# Patient Record
Sex: Male | Born: 1965 | Race: White | Hispanic: No | Marital: Married | Smoking: Current every day smoker
Health system: Southern US, Community
[De-identification: ages and names within clinical notes are randomized; demographics above are authoritative.]

---

## 2005-07-14 ENCOUNTER — Emergency Department (HOSPITAL_COMMUNITY): Admission: EM | Admit: 2005-07-14 | Discharge: 2005-07-15 | Payer: Self-pay | Admitting: Emergency Medicine

## 2010-01-28 ENCOUNTER — Emergency Department (HOSPITAL_COMMUNITY)
Admission: EM | Admit: 2010-01-28 | Discharge: 2010-01-28 | Payer: Self-pay | Source: Home / Self Care | Admitting: Emergency Medicine

## 2012-08-16 IMAGING — CR DG FINGER THUMB 2+V*R*
4 series · 4 of 4 positions shown · non-contrast
Comparison: None.

CLINICAL DATA: Laceration

RIGHT THUMB 2+V

[x finger pa right (1 of 2)]
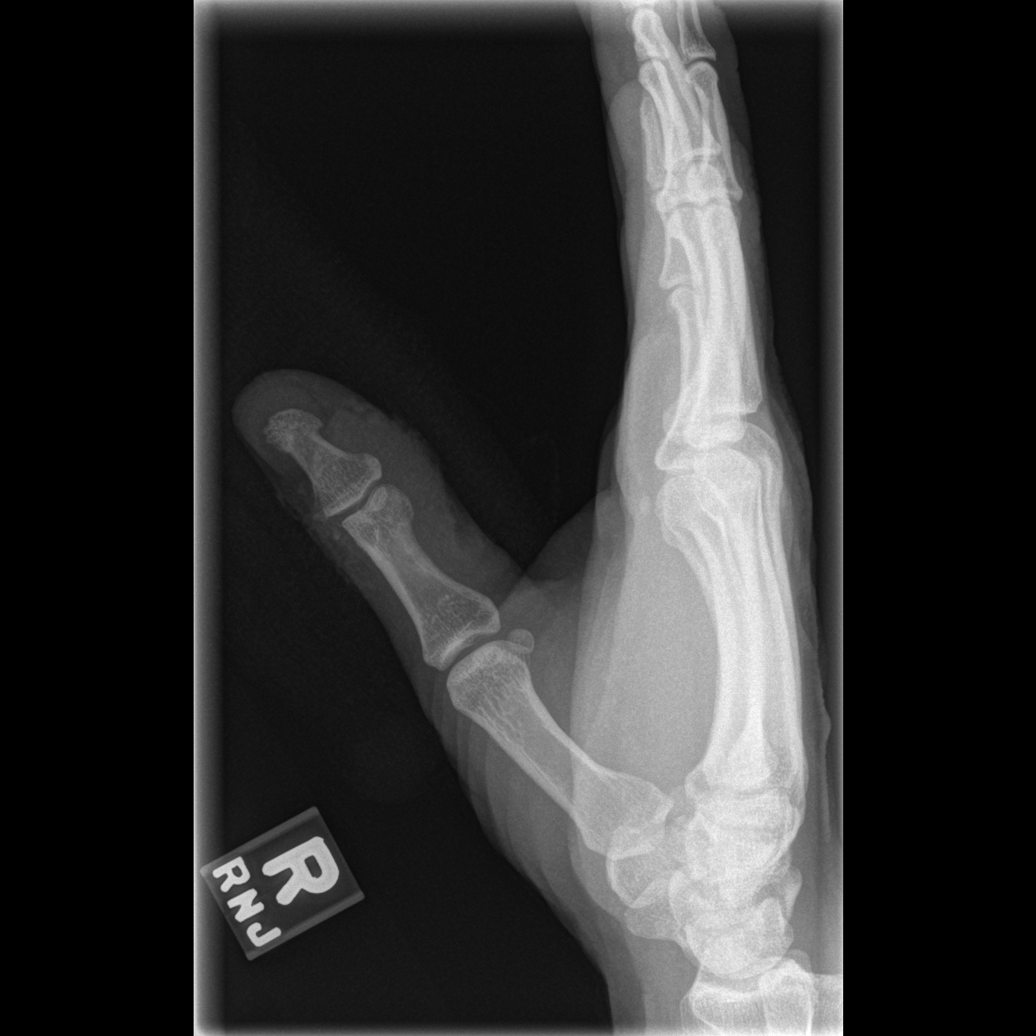

[x finger pa right (2 of 2)]
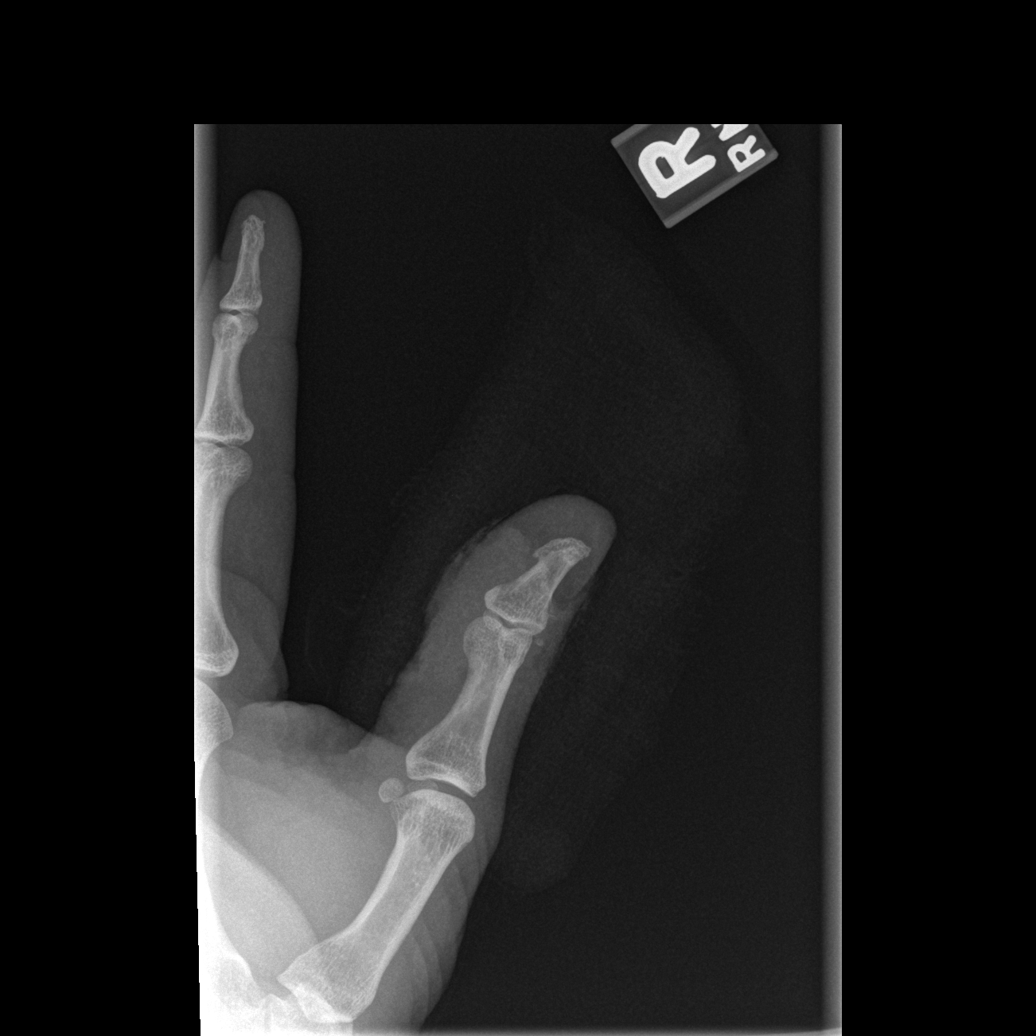

[x finger obl. right]
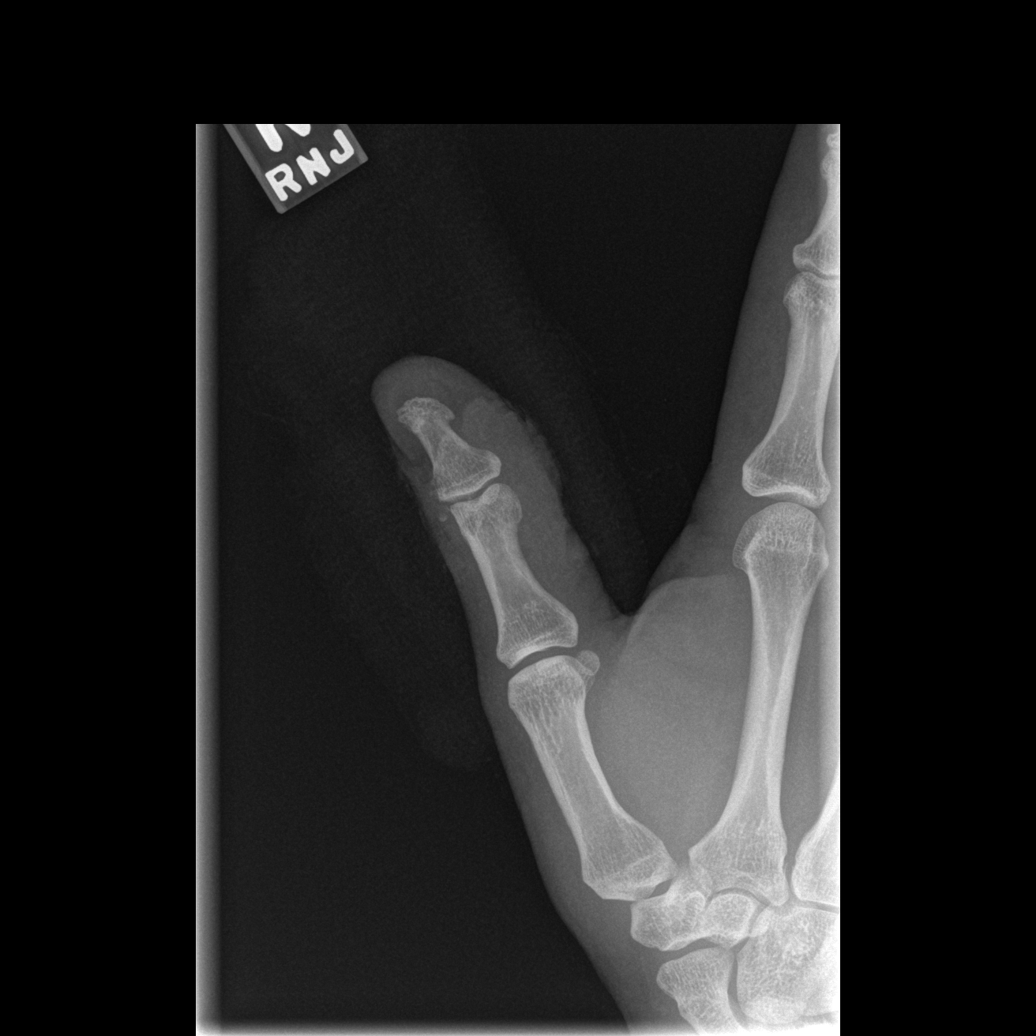

[x finger lateral right]
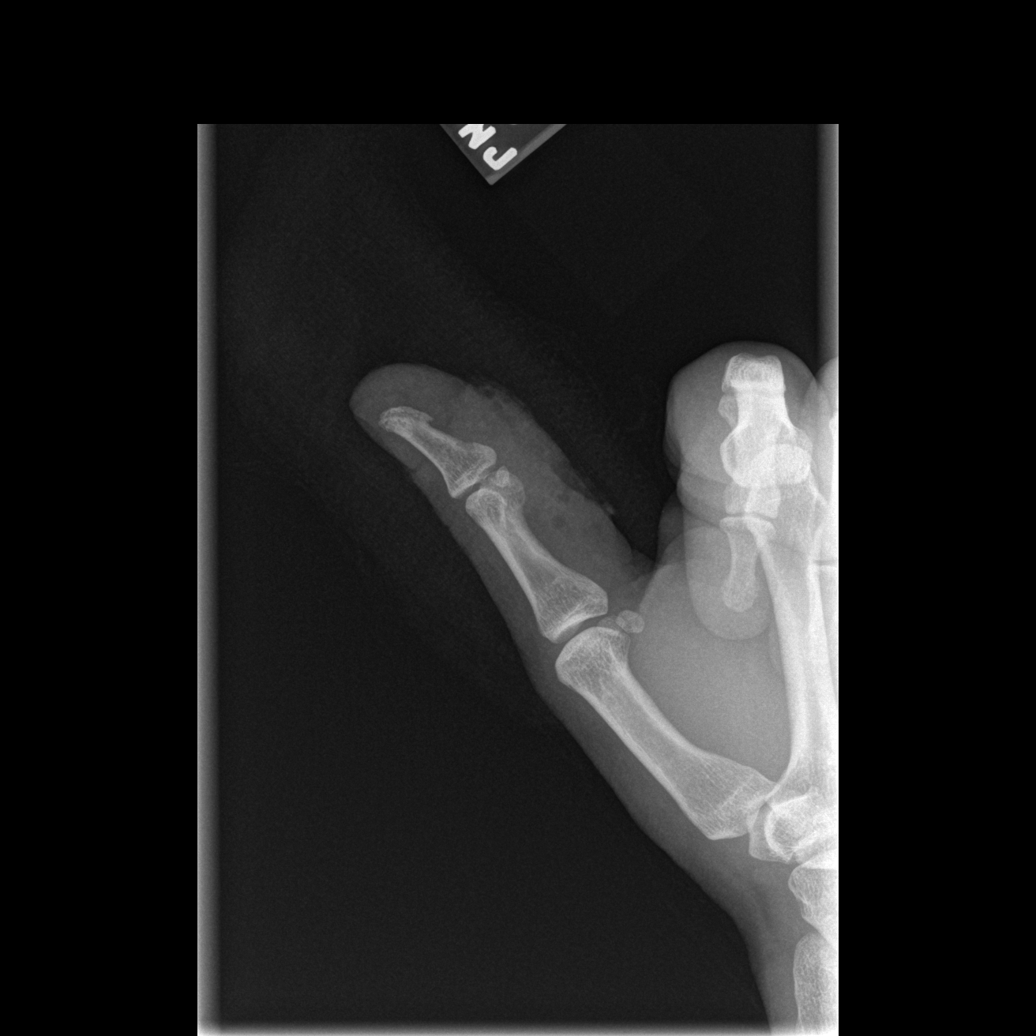

[4 of 4 positions shown; findings below may reference images not displayed]

FINDINGS: There is a cortical defect in the posterior aspect of the
base of the distal phalanx with a small displaced bone fragments.
Otherwise normal  alignment and mineralization.  There is overlying
soft tissue defect.
IMPRESSION: 1.  Small cortical fragments from the posterior aspect base distal
phalanx.

## 2021-10-07 ENCOUNTER — Inpatient Hospital Stay (HOSPITAL_COMMUNITY)
Admission: EM | Admit: 2021-10-07 | Discharge: 2021-10-08 | DRG: 247 | Disposition: A | Discharge status: Home / Self Care | Payer: Self-pay | Attending: Cardiovascular Disease | Admitting: Cardiovascular Disease

## 2021-10-07 ENCOUNTER — Encounter (HOSPITAL_COMMUNITY): Payer: Self-pay | Admitting: Emergency Medicine

## 2021-10-07 ENCOUNTER — Encounter (HOSPITAL_COMMUNITY)
Admission: EM | Disposition: A | Discharge status: Home / Self Care | Payer: Self-pay | Source: Home / Self Care | Attending: Cardiovascular Disease

## 2021-10-07 ENCOUNTER — Emergency Department (HOSPITAL_COMMUNITY): Payer: Self-pay

## 2021-10-07 ENCOUNTER — Other Ambulatory Visit: Payer: Self-pay

## 2021-10-07 DIAGNOSIS — R079 Chest pain, unspecified: Principal | ICD-10-CM

## 2021-10-07 DIAGNOSIS — Z8249 Family history of ischemic heart disease and other diseases of the circulatory system: Secondary | ICD-10-CM

## 2021-10-07 DIAGNOSIS — E876 Hypokalemia: Secondary | ICD-10-CM

## 2021-10-07 DIAGNOSIS — I25110 Atherosclerotic heart disease of native coronary artery with unstable angina pectoris: Principal | ICD-10-CM | POA: Diagnosis present

## 2021-10-07 DIAGNOSIS — J432 Centrilobular emphysema: Secondary | ICD-10-CM

## 2021-10-07 DIAGNOSIS — F17210 Nicotine dependence, cigarettes, uncomplicated: Secondary | ICD-10-CM | POA: Diagnosis present

## 2021-10-07 DIAGNOSIS — I200 Unstable angina: Secondary | ICD-10-CM | POA: Diagnosis present

## 2021-10-07 HISTORY — PX: LEFT HEART CATH AND CORONARY ANGIOGRAPHY: CATH118249

## 2021-10-07 HISTORY — PX: CORONARY STENT INTERVENTION: CATH118234

## 2021-10-07 LAB — CBC
HCT: 44 % (ref 39.0–52.0)
Hemoglobin: 14.7 g/dL (ref 13.0–17.0)
MCH: 32.2 pg (ref 26.0–34.0)
MCHC: 33.4 g/dL (ref 30.0–36.0)
MCV: 96.3 fL (ref 80.0–100.0)
Platelets: 309 10*3/uL (ref 150–400)
RBC: 4.57 MIL/uL (ref 4.22–5.81)
RDW: 12.3 % (ref 11.5–15.5)
WBC: 10.1 10*3/uL (ref 4.0–10.5)
nRBC: 0 % (ref 0.0–0.2)

## 2021-10-07 LAB — POCT ACTIVATED CLOTTING TIME
Activated Clotting Time: 294 seconds
Activated Clotting Time: 570 seconds

## 2021-10-07 LAB — TROPONIN I (HIGH SENSITIVITY)
Troponin I (High Sensitivity): 8 ng/L (ref <18)
Troponin I (High Sensitivity): 7 ng/L (ref <18)

## 2021-10-07 LAB — BASIC METABOLIC PANEL
Anion gap: 10 (ref 5–15)
BUN: 11 mg/dL (ref 6–20)
CO2: 23 mmol/L (ref 22–32)
Calcium: 8.4 mg/dL — ABNORMAL LOW (ref 8.9–10.3)
Chloride: 109 mmol/L (ref 98–111)
Creatinine, Ser: 0.8 mg/dL (ref 0.61–1.24)
GFR, Estimated: >60 mL/min (ref >60)
Glucose, Bld: 111 mg/dL — ABNORMAL HIGH (ref 70–99)
Potassium: 3.3 mmol/L — ABNORMAL LOW (ref 3.5–5.1)
Sodium: 142 mmol/L (ref 135–145)

## 2021-10-07 LAB — HEPATIC FUNCTION PANEL
ALT: 14 U/L (ref 0–44)
AST: 19 U/L (ref 15–41)
Albumin: 3.3 g/dL — ABNORMAL LOW (ref 3.5–5.0)
Alkaline Phosphatase: 49 U/L (ref 38–126)
Bilirubin, Direct: <0.1 mg/dL (ref 0.0–0.2)
Total Bilirubin: 0.5 mg/dL (ref 0.3–1.2)
Total Protein: 6.3 g/dL — ABNORMAL LOW (ref 6.5–8.1)

## 2021-10-07 LAB — LIPID PANEL
Cholesterol: 162 mg/dL (ref 0–200)
HDL: 69 mg/dL (ref >40)
LDL Cholesterol: 62 mg/dL (ref 0–99)
Total CHOL/HDL Ratio: 2.3 RATIO
Triglycerides: 156 mg/dL — ABNORMAL HIGH (ref <150)
VLDL: 31 mg/dL (ref 0–40)

## 2021-10-07 LAB — D-DIMER, QUANTITATIVE: D-Dimer, Quant: <0.27 ug/mL-FEU (ref 0.00–0.50)

## 2021-10-07 SURGERY — LEFT HEART CATH AND CORONARY ANGIOGRAPHY
Anesthesia: LOCAL

## 2021-10-07 MED ORDER — HEPARIN SODIUM (PORCINE) 1000 UNIT/ML IJ SOLN
INTRAMUSCULAR | Status: AC
  Filled 2021-10-07: qty 10

## 2021-10-07 MED ORDER — ONDANSETRON HCL 4 MG/2ML IJ SOLN: 4.0000 mg | Freq: Four times a day (QID) | INTRAMUSCULAR | Status: DC | PRN

## 2021-10-07 MED ORDER — ATORVASTATIN CALCIUM 80 MG PO TABS
80.0000 mg | ORAL_TABLET | Freq: Every day | ORAL | Status: DC
  Administered 2021-10-08: 80 mg via ORAL
  Filled 2021-10-07: qty 1

## 2021-10-07 MED ORDER — HEPARIN SODIUM (PORCINE) 1000 UNIT/ML IJ SOLN
INTRAMUSCULAR | Status: DC | PRN
  Administered 2021-10-07: 4000 [IU] via INTRAVENOUS
  Administered 2021-10-07: 3500 [IU] via INTRAVENOUS

## 2021-10-07 MED ORDER — ASPIRIN 81 MG PO TBEC
81.0000 mg | DELAYED_RELEASE_TABLET | Freq: Every day | ORAL | Status: DC
  Administered 2021-10-08: 81 mg via ORAL
  Filled 2021-10-07: qty 1

## 2021-10-07 MED ORDER — FENTANYL CITRATE (PF) 100 MCG/2ML IJ SOLN
INTRAMUSCULAR | Status: AC
  Filled 2021-10-07: qty 2

## 2021-10-07 MED ORDER — HEPARIN SODIUM (PORCINE) 5000 UNIT/ML IJ SOLN
5000.0000 [IU] | Freq: Three times a day (TID) | INTRAMUSCULAR | Status: DC
  Administered 2021-10-07 – 2021-10-08 (×2): 5000 [IU] via SUBCUTANEOUS
  Filled 2021-10-07 (×2): qty 1

## 2021-10-07 MED ORDER — IOHEXOL 350 MG/ML SOLN
INTRAVENOUS | Status: DC | PRN
  Administered 2021-10-07: 175 mL

## 2021-10-07 MED ORDER — MIDAZOLAM HCL 2 MG/2ML IJ SOLN
INTRAMUSCULAR | Status: AC
  Filled 2021-10-07: qty 2

## 2021-10-07 MED ORDER — POTASSIUM CHLORIDE CRYS ER 20 MEQ PO TBCR
20.0000 meq | EXTENDED_RELEASE_TABLET | Freq: Two times a day (BID) | ORAL | Status: DC
  Administered 2021-10-08: 20 meq via ORAL
  Filled 2021-10-07: qty 1

## 2021-10-07 MED ORDER — SODIUM CHLORIDE 0.9 % WEIGHT BASED INFUSION
1.0000 mL/kg/h | INTRAVENOUS | Status: DC
  Administered 2021-10-07: 1 mL/kg/h via INTRAVENOUS

## 2021-10-07 MED ORDER — METOPROLOL TARTRATE 25 MG PO TABS
25.0000 mg | ORAL_TABLET | Freq: Two times a day (BID) | ORAL | Status: DC
  Administered 2021-10-07 – 2021-10-08 (×2): 25 mg via ORAL
  Filled 2021-10-07 (×2): qty 1

## 2021-10-07 MED ORDER — NITROGLYCERIN 1 MG/10 ML FOR IR/CATH LAB
INTRA_ARTERIAL | Status: DC | PRN
  Administered 2021-10-07 (×2): 200 ug via INTRACORONARY

## 2021-10-07 MED ORDER — SODIUM CHLORIDE 0.9 % IV SOLN: 250.0000 mL | INTRAVENOUS | Status: DC | PRN

## 2021-10-07 MED ORDER — HEPARIN (PORCINE) IN NACL 1000-0.9 UT/500ML-% IV SOLN
INTRAVENOUS | Status: DC | PRN
  Administered 2021-10-07 (×2): 500 mL

## 2021-10-07 MED ORDER — ACETAMINOPHEN 325 MG PO TABS: 650.0000 mg | ORAL_TABLET | ORAL | Status: DC | PRN

## 2021-10-07 MED ORDER — VERAPAMIL HCL 2.5 MG/ML IV SOLN
INTRAVENOUS | Status: AC
  Filled 2021-10-07: qty 2

## 2021-10-07 MED ORDER — LACTATED RINGERS IV BOLUS
500.0000 mL | Freq: Once | INTRAVENOUS | Status: AC
  Administered 2021-10-07: 500 mL via INTRAVENOUS

## 2021-10-07 MED ORDER — ASPIRIN 300 MG RE SUPP
300.0000 mg | RECTAL | Status: DC
  Filled 2021-10-07: qty 1

## 2021-10-07 MED ORDER — CLOPIDOGREL BISULFATE 300 MG PO TABS
ORAL_TABLET | ORAL | Status: AC
Start: 2021-10-07 — End: ?
  Filled 2021-10-07: qty 2

## 2021-10-07 MED ORDER — VERAPAMIL HCL 2.5 MG/ML IV SOLN
INTRAVENOUS | Status: DC | PRN
  Administered 2021-10-07: 10 mL via INTRA_ARTERIAL

## 2021-10-07 MED ORDER — CLOPIDOGREL BISULFATE 300 MG PO TABS
ORAL_TABLET | ORAL | Status: DC | PRN
  Administered 2021-10-07: 600 mg via ORAL

## 2021-10-07 MED ORDER — IPRATROPIUM-ALBUTEROL 0.5-2.5 (3) MG/3ML IN SOLN: 3.0000 mL | RESPIRATORY_TRACT | Status: DC | PRN

## 2021-10-07 MED ORDER — MIDAZOLAM HCL 2 MG/2ML IJ SOLN
INTRAMUSCULAR | Status: DC | PRN
  Administered 2021-10-07: 1 mg via INTRAVENOUS

## 2021-10-07 MED ORDER — NITROGLYCERIN 1 MG/10 ML FOR IR/CATH LAB
INTRA_ARTERIAL | Status: AC
Start: 2021-10-07 — End: ?
  Filled 2021-10-07: qty 10

## 2021-10-07 MED ORDER — NITROGLYCERIN 0.4 MG SL SUBL: 0.4000 mg | SUBLINGUAL_TABLET | SUBLINGUAL | Status: DC | PRN

## 2021-10-07 MED ORDER — SODIUM CHLORIDE 0.9 % WEIGHT BASED INFUSION
1.0000 mL/kg/h | INTRAVENOUS | Status: AC
  Administered 2021-10-07: 1 mL/kg/h via INTRAVENOUS

## 2021-10-07 MED ORDER — POTASSIUM CHLORIDE 20 MEQ PO PACK
20.0000 meq | PACK | ORAL | Status: AC
  Administered 2021-10-07: 20 meq via ORAL

## 2021-10-07 MED ORDER — IOHEXOL 350 MG/ML SOLN
80.0000 mL | Freq: Once | INTRAVENOUS | Status: AC | PRN
  Administered 2021-10-07: 80 mL via INTRAVENOUS

## 2021-10-07 MED ORDER — LIDOCAINE HCL (PF) 1 % IJ SOLN
INTRAMUSCULAR | Status: DC | PRN
  Administered 2021-10-07: 2 mL via INTRADERMAL

## 2021-10-07 MED ORDER — HEPARIN (PORCINE) IN NACL 1000-0.9 UT/500ML-% IV SOLN
INTRAVENOUS | Status: AC
  Filled 2021-10-07: qty 1000

## 2021-10-07 MED ORDER — ASPIRIN 81 MG PO CHEW: 324.0000 mg | CHEWABLE_TABLET | ORAL | Status: DC

## 2021-10-07 MED ORDER — CLOPIDOGREL BISULFATE 75 MG PO TABS
75.0000 mg | ORAL_TABLET | Freq: Every day | ORAL | Status: DC
  Administered 2021-10-08: 75 mg via ORAL
  Filled 2021-10-07: qty 1

## 2021-10-07 MED ORDER — NITROGLYCERIN 0.4 MG SL SUBL
0.4000 mg | SUBLINGUAL_TABLET | SUBLINGUAL | Status: DC | PRN
  Administered 2021-10-07: 0.4 mg via SUBLINGUAL

## 2021-10-07 MED ORDER — FENTANYL CITRATE (PF) 100 MCG/2ML IJ SOLN
INTRAMUSCULAR | Status: DC | PRN
  Administered 2021-10-07: 25 ug via INTRAVENOUS
  Administered 2021-10-07: 50 ug via INTRAVENOUS

## 2021-10-07 MED ORDER — SODIUM CHLORIDE 0.9% FLUSH: 3.0000 mL | INTRAVENOUS | Status: DC | PRN

## 2021-10-07 MED ORDER — SODIUM CHLORIDE 0.9 % WEIGHT BASED INFUSION
3.0000 mL/kg/h | INTRAVENOUS | Status: DC
  Administered 2021-10-07: 3 mL/kg/h via INTRAVENOUS

## 2021-10-07 MED ORDER — ASPIRIN 81 MG PO CHEW: 81.0000 mg | CHEWABLE_TABLET | ORAL | Status: DC

## 2021-10-07 MED ORDER — SODIUM CHLORIDE 0.9% FLUSH
3.0000 mL | Freq: Two times a day (BID) | INTRAVENOUS | Status: DC
  Administered 2021-10-08 (×2): 3 mL via INTRAVENOUS

## 2021-10-07 MED ORDER — ASPIRIN 81 MG PO CHEW
324.0000 mg | CHEWABLE_TABLET | Freq: Once | ORAL | Status: AC
  Administered 2021-10-07: 324 mg via ORAL

## 2021-10-07 MED ORDER — LIDOCAINE HCL (PF) 1 % IJ SOLN
INTRAMUSCULAR | Status: AC
  Filled 2021-10-07: qty 30

## 2021-10-07 SURGICAL SUPPLY — 19 items
BALL SAPPHIRE NC24 2.75X18 (BALLOONS) ×1
BALLN SAPPHIRE 2.5X15 (BALLOONS) ×1
BALLOON SAPPHIRE 2.5X15 (BALLOONS) IMPLANT
BALLOON SAPPHIRE NC24 2.75X18 (BALLOONS) IMPLANT
CATH INFINITI 5FR JK (CATHETERS) IMPLANT
CATH INFINITI JR4 5F (CATHETERS) IMPLANT
CATH LAUNCHER 6FR EBU3.5 (CATHETERS) IMPLANT
DEVICE RAD COMP TR BAND LRG (VASCULAR PRODUCTS) IMPLANT
GLIDESHEATH SLEND SS 6F .021 (SHEATH) IMPLANT
GUIDEWIRE INQWIRE 1.5J.035X260 (WIRE) IMPLANT
INQWIRE 1.5J .035X260CM (WIRE) ×1
KIT ENCORE 26 ADVANTAGE (KITS) IMPLANT
KIT HEART LEFT (KITS) ×1 IMPLANT
PACK CARDIAC CATHETERIZATION (CUSTOM PROCEDURE TRAY) ×1 IMPLANT
STENT SYNERGY XD 2.50X32 (Permanent Stent) IMPLANT
SYNERGY XD 2.50X32 (Permanent Stent) ×1 IMPLANT
TRANSDUCER W/STOPCOCK (MISCELLANEOUS) ×1 IMPLANT
TUBING CIL FLEX 10 FLL-RA (TUBING) ×1 IMPLANT
WIRE RUNTHROUGH .014X180CM (WIRE) IMPLANT

## 2021-10-07 NOTE — Interval H&P Note (Signed)
History and Physical Interval Note:  10/07/2021 3:27 PM  Steven Shepherd  has presented today for surgery, with the diagnosis of unstable angina.  The various methods of treatment have been discussed with the patient and family. After consideration of risks, benefits and other options for treatment, the patient has consented to  Procedure(s): LEFT HEART CATH AND CORONARY ANGIOGRAPHY (N/A) as a surgical intervention.  The patient's history has been reviewed, patient examined, no change in status, stable for surgery.  I have reviewed the patient's chart and labs.  Questions were answered to the patient's satisfaction.     [name]

## 2021-10-07 NOTE — ED Notes (Signed)
Pt to Xray.

## 2021-10-07 NOTE — ED Triage Notes (Addendum)
Per [facility], hone alone, C/O right sided chest pain X2 hours.  ETOH on board and heavy smoker, Pt has no hx, has not been to a doctor "in years."    140 HR - no ST elevation 324 ASA 1 nirto 140 to 112/68 P 110 95% RA  18G R upper arm

## 2021-10-07 NOTE — ED Provider Notes (Signed)
[facility] EMERGENCY DEPARTMENT Provider Note   CSN: [id number] Arrival date & time: 10/07/21  1761     History  Chief Complaint  Patient presents with   Chest Pain    Steven Shepherd is a 56 y.o. male.  HPI 56 yo male complaining of left chest pain feels like it is inside and pounding.  He has had in the past but never evauated.  Last night worsened and felt "like I was going to die."  Some dyspnea beyond baseline.  Patient smokes a pack a day, and drinks some beer. Pain was 10/10 now hurts more when he breathes in.  Pain is decreased to 7/10. No ho dvt or pe      Home Medications Prior to Admission medications   Not on File      Allergies    Other    Review of Systems   Review of Systems  Physical Exam Updated Vital Signs BP (!) 147/83 (BP Location: Left Arm)   Pulse 67   Temp 98 F (36.7 C) (Oral)   Resp 17   Ht 1.702 m (5\' 7" )   Wt 72.1 kg   SpO2 94%   BMI 24.90 kg/m  Physical Exam Vitals and nursing note reviewed.  Constitutional:      General: He is not in acute distress.    Appearance: He is well-developed. He is not ill-appearing.  HENT:     Head: Normocephalic.  Eyes:     Pupils: Pupils are equal, round, and reactive to light.  Cardiovascular:     Rate and Rhythm: Regular rhythm. Tachycardia present.     Heart sounds: Normal heart sounds.  Pulmonary:     Effort: Pulmonary effort is normal.     Breath sounds: Examination of the right-middle field reveals rhonchi. Examination of the left-middle field reveals rhonchi. Examination of the right-lower field reveals rhonchi. Examination of the left-lower field reveals rhonchi. Rhonchi present.  Abdominal:     General: Bowel sounds are normal.     Palpations: Abdomen is soft.  Musculoskeletal:     Cervical back: Normal range of motion and neck supple.  Skin:    General: Skin is warm and dry.     Capillary Refill: Capillary refill takes less than 2 seconds.  Neurological:      General: No focal deficit present.     Mental Status: He is alert.     ED Results / Procedures / Treatments   Labs (all labs ordered are listed, but only abnormal results are displayed) Labs Reviewed  BASIC METABOLIC PANEL - Abnormal; Notable for the following components:      Result Value   Potassium 3.3 (*)    Glucose, Bld 111 (*)    Calcium 8.4 (*)    All other components within normal limits  HEPATIC FUNCTION PANEL - Abnormal; Notable for the following components:   Total Protein 6.3 (*)    Albumin 3.3 (*)    All other components within normal limits  LIPID PANEL - Abnormal; Notable for the following components:   Triglycerides 156 (*)    All other components within normal limits  CBC - Abnormal; Notable for the following components:   WBC 10.6 (*)    All other components within normal limits  CBC  D-DIMER, QUANTITATIVE  BASIC METABOLIC PANEL  HEMOGLOBIN A1C  LIPOPROTEIN A (LPA)  HIV ANTIBODY (ROUTINE TESTING W REFLEX)  POCT ACTIVATED CLOTTING TIME  POCT ACTIVATED CLOTTING TIME  TROPONIN I (HIGH  SENSITIVITY)  TROPONIN I (HIGH SENSITIVITY)    EKG EKG Interpretation  Date/Time:  Wednesday October 07 2021 06:51:16 EDT Ventricular Rate:  102 PR Interval:  160 QRS Duration: 89 QT Interval:  347 QTC Calculation: 455 R Axis:   57 Text Interpretation: Sinus tachycardia Biatrial enlargement Anteroseptal infarct, old Confirmed by Margarita Grizzle [phone]) on 10/07/2021 7:20:53 AM  Radiology CARDIAC CATHETERIZATION  Result Date: 10/07/2021   2nd Mrg lesion is 60% stenosed.   1st Diag lesion is 60% stenosed.   Dist LAD lesion is 50% stenosed.   Mid LAD lesion is 90% stenosed.   Mid RCA lesion is 40% stenosed.   RPAV lesion is 60% stenosed.   A drug-eluting stent was successfully placed using a SYNERGY XD [id number].   Post intervention, there is a 0% residual stenosis.   The left ventricular systolic function is normal.   LV end diastolic pressure is mildly elevated.   The left  ventricular ejection fraction is 55-65% by visual estimate. 1.  Mildly to moderately calcified coronary arteries with severe one-vessel coronary artery disease involving the mid LAD.  In addition, there is moderate disease in OM 2, first diagonal and right coronary arteries. 2.  Normal LV systolic function mildly elevated left ventricular end-diastolic pressure. 3.  Successful angioplasty and drug-eluting stent placement to the mid LAD. Recommendations: Dual antiplatelet therapy for at least 6 months. Aggressive treatment of risk factors and smoking cessation. No further revascularization is needed at this point.   CT Angio Chest/Abd/Pel for Dissection W and/or Wo Contrast  Result Date: 10/07/2021 CLINICAL DATA:  Chest pain for 2 hours. EXAM: CT ANGIOGRAPHY CHEST, ABDOMEN AND PELVIS TECHNIQUE: Non-contrast CT of the chest was initially obtained. Multidetector CT imaging through the chest, abdomen and pelvis was performed using the standard protocol during bolus administration of intravenous contrast. Multiplanar reconstructed images and MIPs were obtained and reviewed to evaluate the vascular anatomy. RADIATION DOSE REDUCTION: This exam was performed according to the departmental dose-optimization program which includes automated exposure control, adjustment of the mA and/or kV according to patient size and/or use of iterative reconstruction technique. CONTRAST:  46mL OMNIPAQUE IOHEXOL 350 MG/ML SOLN COMPARISON:  None Available. FINDINGS: CTA CHEST FINDINGS Cardiovascular: Preferential opacification of the thoracic aorta. No evidence of thoracic aortic aneurysm or dissection. Normal heart size. No pericardial effusion. Multi-vessel coronary artery atherosclerosis. Mediastinum/Nodes: No enlarged mediastinal, hilar, or axillary lymph nodes. Thyroid gland, trachea, and esophagus demonstrate no significant findings. Lungs/Pleura: Bibasilar atelectasis. No focal consolidation, pleural effusion or pneumothorax. Mild  bronchial wall thickening as can be seen with chronic bronchitis. Musculoskeletal: No acute osseous abnormality. No aggressive osseous lesion. Review of the MIP images confirms the above findings. CTA ABDOMEN AND PELVIS FINDINGS VASCULAR Aorta: Normal caliber aorta without aneurysm, dissection, vasculitis or significant stenosis. Abdominal aortic atherosclerosis. Celiac: Patent without evidence of aneurysm, dissection, vasculitis or significant stenosis. SMA: Patent without evidence of aneurysm, dissection, vasculitis or significant stenosis. Renals: Both renal arteries are patent without evidence of aneurysm, dissection, vasculitis, fibromuscular dysplasia or significant stenosis. IMA: Patent without evidence of aneurysm, dissection, vasculitis or significant stenosis. Inflow: Patent without evidence of aneurysm, dissection, vasculitis or significant stenosis. Veins: No obvious venous abnormality within the limitations of this arterial phase study. Review of the MIP images confirms the above findings. NON-VASCULAR Hepatobiliary: No focal liver abnormality is seen. No gallstones, gallbladder wall thickening, or biliary dilatation. Pancreas: Unremarkable. No pancreatic ductal dilatation or surrounding inflammatory changes. Spleen: Normal in size without focal abnormality. Adrenals/Urinary Tract: Adrenal  glands are unremarkable. Kidneys are normal, without renal calculi, focal lesion, or hydronephrosis. Bladder is unremarkable. Stomach/Bowel: Stomach is within normal limits. No evidence of bowel wall thickening, distention, or inflammatory changes. Appendix is normal. Vascular/Lymphatic: No significant vascular findings are present. No enlarged abdominal or pelvic lymph nodes. Reproductive: Prostate is unremarkable. Other: No abdominal wall hernia or abnormality. No abdominopelvic ascites. Musculoskeletal: No acute osseous abnormality. No aggressive osseous lesion. Review of the MIP images confirms the above findings.  IMPRESSION: 1. No evidence of an aortic aneurysm or dissection. 2. Multi-vessel coronary artery atherosclerosis. 3. Mild bronchial wall thickening as can be seen with chronic bronchitis. Electronically Signed   By: Elige Ko M.D.   On: 10/07/2021 09:33   DG Chest 2 View  Result Date: 10/07/2021 CLINICAL DATA:  Chest pain. EXAM: CHEST - 2 VIEW COMPARISON:  6/14/7 FINDINGS: Heart size and mediastinal contours are unremarkable. No pleural effusion or edema identified. No airspace opacities. Mild degenerative disc disease within the thoracic spine. IMPRESSION: No active cardiopulmonary abnormalities. Electronically Signed   By: Signa Kell M.D.   On: 10/07/2021 07:20    Procedures Procedures    Medications Ordered in ED Medications  ipratropium-albuterol (DUONEB) 0.5-2.5 (3) MG/3ML nebulizer solution 3 mL ( Nebulization MAR Unhold 10/07/21 1645)  aspirin chewable tablet 324 mg (324 mg Oral Not Given 10/07/21 2210)    Or  aspirin suppository 300 mg ( Rectal See Alternative 10/07/21 2210)  aspirin EC tablet 81 mg (81 mg Oral Given 10/08/21 0939)  nitroGLYCERIN (NITROSTAT) SL tablet 0.4 mg (has no administration in time range)  heparin injection 5,000 Units (5,000 Units Subcutaneous Given 10/08/21 0524)  atorvastatin (LIPITOR) tablet 80 mg (80 mg Oral Given 10/08/21 0939)  potassium chloride SA (KLOR-CON M) CR tablet 20 mEq (20 mEq Oral Given 10/08/21 0939)  acetaminophen (TYLENOL) tablet 650 mg (has no administration in time range)  ondansetron (ZOFRAN) injection 4 mg (has no administration in time range)  0.9% sodium chloride infusion (1 mL/kg/hr  72.6 kg Intravenous New Bag/Given 10/07/21 1653)  sodium chloride flush (NS) 0.9 % injection 3 mL (3 mLs Intravenous Given 10/08/21 0939)  sodium chloride flush (NS) 0.9 % injection 3 mL (has no administration in time range)  0.9 %  sodium chloride infusion (has no administration in time range)  clopidogrel (PLAVIX) tablet 75 mg (75 mg Oral Given 10/08/21 0939)   metoprolol tartrate (LOPRESSOR) tablet 25 mg (25 mg Oral Given 10/08/21 0939)  perflutren lipid microspheres (DEFINITY) IV suspension (2 mLs Intravenous Given 10/08/21 0853)  aspirin chewable tablet 324 mg (324 mg Oral Given 10/07/21 0752)  lactated ringers bolus 500 mL (0 mLs Intravenous Stopped 10/07/21 0901)  potassium chloride (KLOR-CON) packet 20 mEq (20 mEq Oral Given 10/07/21 0906)  lactated ringers bolus 500 mL (0 mLs Intravenous Stopped 10/07/21 0922)  iohexol (OMNIPAQUE) 350 MG/ML injection 80 mL (80 mLs Intravenous Contrast Given 10/07/21 9983)    ED Course/ Medical Decision Making/ A&P Clinical Course as of 10/08/21 0944  Wed Oct 07, 2021  0849 D-dimer reviewed and interpreted and normal  [DR]  0849 Troponin I (High Sensitivity) Troponin reviewed interpreted and normal [DR]  0850 Cxr reviewed and interpreted and normal [DR]  0850 Bmet reviewed and interpreted and mild [DR]  1006 Patient states pain has resolved after nitro [DR]    Clinical Course User Index [DR] Margarita Grizzle, MD  Medical Decision Making 56 yo male presents today with recurrent chest pain which is substernal and nonreproducible. +Family history +tobacco use+etoh use Patient without regular medical care EKG without acute st changes First troponin negative Pain relieved with nitro and had aspirin CT without acute aneurysm or dissection Multivessel cad noted on ct Patient now pain free Given risk factors and cad seen on ct, plan admission for further evaluation and management   Amount and/or Complexity of Data Reviewed External Data Reviewed: ECG. Labs: ordered. Decision-making details documented in ED Course. Radiology: ordered and independent interpretation performed. Decision-making details documented in ED Course. ECG/medicine tests: ordered.  Risk OTC drugs. Prescription drug management. Decision regarding hospitalization.   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

## 2021-10-07 NOTE — Progress Notes (Signed)
Pt received from cath lab AxOx4, VS wnL and as per flow. (R) radial site C/D/I level 0. Sequence vitals started.  All questions and concerns addressed. Call bell placed within reach, will continue to monitor and maintain safety.

## 2021-10-07 NOTE — H&P (Addendum)
Cardiology Admission History and Physical   Patient ID: Steven Shepherd MRN: [id number]; DOB: [birth year]   Admission date: 10/07/2021  PCP:  Pcp, No   [facility] Providers Cardiologist:  None        Chief Complaint:  Chest Pain   Patient Profile:   Steven Shepherd is a 56 y.o. male with no past medical history who is being seen 10/07/2021 for the evaluation of atypical chest pain.  History of Present Illness:   Steven Shepherd is a 56 year old male with no past medical history as he hasn't seen a doctor in over 10 years. He states he has been having intermittent chest pain for about 4-5 years that would occur sporadically. Each time he had an episode, he states the pain would be about 10/10, and would hold his breath and the pain would go away within two minutes. No associated symptoms while chest pain was occurring. Last night, he was watching television when the chest pain occurred, however this time it did not stop and continued until he called EMS. Last night's episode he denies any dyspnea, radiation, or leg swelling. 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 He denies any dyspnea, orthopnea, lower extremity edema, or exacerbating factors.    History reviewed. No pertinent past medical history.  History reviewed. No pertinent surgical history.   Medications Prior to Admission: Prior to Admission medications   Not on File     Allergies:   No Known Allergies  Social History:   Steven Shepherd is semi-retired, and works Pension scheme manager and modifying cars. He does not have a PCP. Lives at [facility] by himself. He has a 40 pack year smoking history, and has been drinking 10-12 beers weekly for the past 40 years.    Social History   Socioeconomic History   Marital status: Married    Spouse name: Not on file   Number of children: Not on file   Years of education: Not on file   Highest education level: Not on file  Occupational  History   Not on file  Tobacco Use   Smoking status: Not on file   Smokeless tobacco: Not on file  Substance and Sexual Activity   Alcohol use: Not on file   Drug use: Not on file   Sexual activity: Not on file  Other Topics Concern   Not on file  Social History Narrative   Not on file   Social Determinants of Health   Financial Resource Strain: Not on file  Food Insecurity: Not on file  Transportation Needs: Not on file  Physical Activity: Not on file  Stress: Not on file  Social Connections: Not on file  Intimate Partner Violence: Not on file    Family History:   Father had an MI at 64  ROS:  Please see the history of present illness.  All other ROS reviewed and negative.     Physical Exam/Data:   Vitals:   10/07/21 1105 10/07/21 1110 10/07/21 1115 10/07/21 1120  BP: 114/69 120/82 118/69 109/73  Pulse: 91 70 79 79  Resp: (!) 31 15 15 15   Temp:      TempSrc:      SpO2: 95% 94% 92% 92%  Weight:      Height:        Intake/Output Summary (Last 24 hours) at 10/07/2021 1140 Last data filed at 10/07/2021 [phone]  Gross per 24 hour  Intake 1000 ml  Output --  Net 1000 ml      10/07/2021    6:54 AM  Last 3 Weights  Weight (lbs) 160 lb  Weight (kg) 72.576 kg     Body mass index is 25.06 kg/m.  General:  Well nourished, well developed, in no acute distress HEENT: normal Neck: no JVD Vascular: No carotid bruits; Distal pulses 2+ bilaterally   Cardiac:  normal S1, S2; RRR; no murmurs Lungs:  Wheezes heard bilaterally on auscultation Abd: soft, nontender, no hepatomegaly  Ext: no lower extremity edema Musculoskeletal:  No deformities, BUE and BLE strength normal and equal Skin: warm and dry  Neuro:  CNs 2-12 intact, no focal abnormalities noted Psych:  Normal affect    EKG:  The ECG that was done revealed sinus tachycardia  Relevant CV Studies: IMPRESSION: 1. No evidence of an aortic aneurysm or dissection. 2. Multi-vessel coronary artery atherosclerosis. 3.  Mild bronchial wall thickening as can be seen with chronic bronchitis.  Laboratory Data:  High Sensitivity Troponin:   Recent Labs  Lab 10/07/21 0700 10/07/21 0910  TROPONINIHS 8 7      Chemistry Recent Labs  Lab 10/07/21 0700  NA 142  K 3.3*  CL 109  CO2 23  GLUCOSE 111*  BUN 11  CREATININE 0.80  CALCIUM 8.4*  GFRNONAA >60  ANIONGAP 10    Recent Labs  Lab 10/07/21 0700  PROT 6.3*  ALBUMIN 3.3*  AST 19  ALT 14  ALKPHOS 49  BILITOT 0.5   Lipids No results for input(s): "CHOL", "TRIG", "HDL", "LABVLDL", "LDLCALC", "CHOLHDL" in the last 168 hours. Hematology Recent Labs  Lab 10/07/21 0700  WBC 10.1  RBC 4.57  HGB 14.7  HCT 44.0  MCV 96.3  MCH 32.2  MCHC 33.4  RDW 12.3  PLT 309   Thyroid No results for input(s): "TSH", "FREET4" in the last 168 hours. BNPNo results for input(s): "BNP", "PROBNP" in the last 168 hours.  DDimer  Recent Labs  Lab 10/07/21 0748  DDIMER <0.27     Radiology/Studies:  CT Angio Chest/Abd/Pel for Dissection W and/or Wo Contrast  Result Date: 10/07/2021 CLINICAL DATA:  Chest pain for 2 hours. EXAM: CT ANGIOGRAPHY CHEST, ABDOMEN AND PELVIS TECHNIQUE: Non-contrast CT of the chest was initially obtained. Multidetector CT imaging through the chest, abdomen and pelvis was performed using the standard protocol during bolus administration of intravenous contrast. Multiplanar reconstructed images and MIPs were obtained and reviewed to evaluate the vascular anatomy. RADIATION DOSE REDUCTION: This exam was performed according to the departmental dose-optimization program which includes automated exposure control, adjustment of the mA and/or kV according to patient size and/or use of iterative reconstruction technique. CONTRAST:  67mL OMNIPAQUE IOHEXOL 350 MG/ML SOLN COMPARISON:  None Available. FINDINGS: CTA CHEST FINDINGS Cardiovascular: Preferential opacification of the thoracic aorta. No evidence of thoracic aortic aneurysm or dissection.  Normal heart size. No pericardial effusion. Multi-vessel coronary artery atherosclerosis. Mediastinum/Nodes: No enlarged mediastinal, hilar, or axillary lymph nodes. Thyroid gland, trachea, and esophagus demonstrate no significant findings. Lungs/Pleura: Bibasilar atelectasis. No focal consolidation, pleural effusion or pneumothorax. Mild bronchial wall thickening as can be seen with chronic bronchitis. Musculoskeletal: No acute osseous abnormality. No aggressive osseous lesion. Review of the MIP images confirms the above findings. CTA ABDOMEN AND PELVIS FINDINGS VASCULAR Aorta: Normal caliber aorta without aneurysm, dissection, vasculitis or significant stenosis. Abdominal aortic atherosclerosis. Celiac: Patent without evidence of aneurysm, dissection, vasculitis or significant stenosis. SMA: Patent without  evidence of aneurysm, dissection, vasculitis or significant stenosis. Renals: Both renal arteries are patent without evidence of aneurysm, dissection, vasculitis, fibromuscular dysplasia or significant stenosis. IMA: Patent without evidence of aneurysm, dissection, vasculitis or significant stenosis. Inflow: Patent without evidence of aneurysm, dissection, vasculitis or significant stenosis. Veins: No obvious venous abnormality within the limitations of this arterial phase study. Review of the MIP images confirms the above findings. NON-VASCULAR Hepatobiliary: No focal liver abnormality is seen. No gallstones, gallbladder wall thickening, or biliary dilatation. Pancreas: Unremarkable. No pancreatic ductal dilatation or surrounding inflammatory changes. Spleen: Normal in size without focal abnormality. Adrenals/Urinary Tract: Adrenal glands are unremarkable. Kidneys are normal, without renal calculi, focal lesion, or hydronephrosis. Bladder is unremarkable. Stomach/Bowel: Stomach is within normal limits. No evidence of bowel wall thickening, distention, or inflammatory changes. Appendix is normal.  Vascular/Lymphatic: No significant vascular findings are present. No enlarged abdominal or pelvic lymph nodes. Reproductive: Prostate is unremarkable. Other: No abdominal wall hernia or abnormality. No abdominopelvic ascites. Musculoskeletal: No acute osseous abnormality. No aggressive osseous lesion. Review of the MIP images confirms the above findings. IMPRESSION: 1. No evidence of an aortic aneurysm or dissection. 2. Multi-vessel coronary artery atherosclerosis. 3. Mild bronchial wall thickening as can be seen with chronic bronchitis. Electronically Signed   By: Elige Ko M.D.   On: 10/07/2021 09:33   DG Chest 2 View  Result Date: 10/07/2021 CLINICAL DATA:  Chest pain. EXAM: CHEST - 2 VIEW COMPARISON:  6/14/7 FINDINGS: Heart size and mediastinal contours are unremarkable. No pleural effusion or edema identified. No airspace opacities. Mild degenerative disc disease within the thoracic spine. IMPRESSION: No active cardiopulmonary abnormalities. Electronically Signed   By: Signa Kell M.D.   On: 10/07/2021 07:20     Assessment and Plan:   #Unstable Angina Patient has been complaining of intermittent squeezing chest pain for that last 4-5 years. He has not been seen by a doctor in over 10 years, and has no previous medication use history. He denies any dyspnea, lower extremity edema. EKG revealed sinus tachycardia with evidence of previous anteroseptal infarct . CT Angio showed multi-vessel coronary artery atherosclerosis, and mild bronchial wall thickening that can be seen with chronic bronchitis. No PE was visualized, and D-Dimer was within normal limits. X-Ray revealed No pleural effusion or edema, and heart size + mediastinal contours are unremarkable.   Unlikely to be STEMI/NSTEMI due to no indication on EKG and chronicity of the pain,  troponins have also been negative. This could be severe coronary artery disease due to patient's smoking history, non-compliance with medical visits, and  unhealthy lifestyle.   Plan:  - Steven Shepherd has diffuse calcifications of multi-vessel arteries, will plan for cath today to further evaluate - Lipid panel ordered  -Started Lipitor 80mg   #Likely COPD Steven Shepherd has a 40 pack year history, and on auscultation wheezes were heard bilaterally throughout lung field.   Plan:  -Duonebs Q6PRN - Likely will need outpatient pulmonology PFT follow up  #Hypokalemia Steven Shepherd's potassium level on admission is 3.3. Will give potassium supplements starting tomorrow for 6 doses.     Risk Assessment/Risk Scores:    HEAR Score (for undifferentiated chest pain):   { 2       For questions or updates, please contact [facility] Please consult [email or web address] for contact info under     Signed, , MD  10/07/2021 11:40 AM    Patient examined chart reviewed Discussed care with resident. Exam with bronchitic male with emphysema Skin tattoos No murmur  abdomen benign No edema good peripheral pulses with good right radial. Pain is somewhat atypical with no acute ECG changes and negative troponin. However his CTA done to r/o PE showed very high calcium score ( likely over 1000 ) including ostial LAD/LCX and RCA. Given this favor diagnostic cath to r/o obstructive CAD as cause of pain. Risks including stroke, urgent surgery, bleeding discussed no issues with contrast Willing to proceed Orders written lab notified Nebs for wheezing Counseled about smoking cessation Needs  primary care doctor   Charlton Haws MD [facility]

## 2021-10-07 NOTE — Plan of Care (Signed)
  Problem: Education: Goal: Understanding of CV disease, CV risk reduction, and recovery process will improve Outcome: Progressing Goal: Individualized Educational Video(s) Outcome: Progressing   Problem: Activity: Goal: Ability to return to baseline activity level will improve Outcome: Progressing   Problem: Cardiovascular: Goal: Ability to achieve and maintain adequate cardiovascular perfusion will improve Outcome: Progressing Goal: Vascular access site(s) Level 0-1 will be maintained Outcome: Progressing   Problem: Health Behavior/Discharge Planning: Goal: Ability to safely manage health-related needs after discharge will improve Outcome: Progressing   Problem: Education: Goal: Understanding of cardiac disease, CV risk reduction, and recovery process will improve Outcome: Progressing Goal: Individualized Educational Video(s) Outcome: Progressing   Problem: Activity: Goal: Ability to tolerate increased activity will improve Outcome: Progressing   Problem: Cardiac: Goal: Ability to achieve and maintain adequate cardiovascular perfusion will improve Outcome: Progressing   Problem: Health Behavior/Discharge Planning: Goal: Ability to safely manage health-related needs after discharge will improve Outcome: Progressing   Problem: Education: Goal: Knowledge of General Education information will improve Description: Including pain rating scale, medication(s)/side effects and non-pharmacologic comfort measures Outcome: Progressing   Problem: Health Behavior/Discharge Planning: Goal: Ability to manage health-related needs will improve Outcome: Progressing   Problem: Clinical Measurements: Goal: Ability to maintain clinical measurements within normal limits will improve Outcome: Progressing Goal: Will remain free from infection Outcome: Progressing Goal: Diagnostic test results will improve Outcome: Progressing Goal: Respiratory complications will improve Outcome:  Progressing Goal: Cardiovascular complication will be avoided Outcome: Progressing   Problem: Activity: Goal: Risk for activity intolerance will decrease Outcome: Progressing   Problem: Nutrition: Goal: Adequate nutrition will be maintained Outcome: Progressing   Problem: Coping: Goal: Level of anxiety will decrease Outcome: Progressing   Problem: Elimination: Goal: Will not experience complications related to bowel motility Outcome: Progressing Goal: Will not experience complications related to urinary retention Outcome: Progressing   Problem: Pain Managment: Goal: General experience of comfort will improve Outcome: Progressing   Problem: Safety: Goal: Ability to remain free from injury will improve Outcome: Progressing   Problem: Skin Integrity: Goal: Risk for impaired skin integrity will decrease Outcome: Progressing   

## 2021-10-08 ENCOUNTER — Other Ambulatory Visit: Payer: Self-pay | Admitting: Physician Assistant

## 2021-10-08 ENCOUNTER — Encounter (HOSPITAL_COMMUNITY): Payer: Self-pay | Admitting: Cardiovascular Disease

## 2021-10-08 ENCOUNTER — Inpatient Hospital Stay (HOSPITAL_COMMUNITY): Payer: Self-pay

## 2021-10-08 DIAGNOSIS — E876 Hypokalemia: Secondary | ICD-10-CM

## 2021-10-08 DIAGNOSIS — J432 Centrilobular emphysema: Secondary | ICD-10-CM

## 2021-10-08 DIAGNOSIS — R079 Chest pain, unspecified: Secondary | ICD-10-CM

## 2021-10-08 LAB — CBC
HCT: 43.6 % (ref 39.0–52.0)
Hemoglobin: 14.8 g/dL (ref 13.0–17.0)
MCH: 32.2 pg (ref 26.0–34.0)
MCHC: 33.9 g/dL (ref 30.0–36.0)
MCV: 94.8 fL (ref 80.0–100.0)
Platelets: 296 10*3/uL (ref 150–400)
RBC: 4.6 MIL/uL (ref 4.22–5.81)
RDW: 12.3 % (ref 11.5–15.5)
WBC: 10.6 10*3/uL — ABNORMAL HIGH (ref 4.0–10.5)
nRBC: 0 % (ref 0.0–0.2)

## 2021-10-08 LAB — ECHOCARDIOGRAM COMPLETE
AR max vel: 2.94 cm2
AV Area VTI: 3.31 cm2
AV Area mean vel: 3.01 cm2
AV Mean grad: 3 mmHg
AV Peak grad: 6 mmHg
Ao pk vel: 1.22 m/s
Area-P 1/2: 3.1 cm2
Calc EF: 63.6 %
Height: 67 in
MV M vel: 3.33 m/s
MV Peak grad: 44.4 mmHg
S' Lateral: 2.7 cm
Single Plane A2C EF: 65 %
Single Plane A4C EF: 64.3 %
Weight: 2544 oz

## 2021-10-08 LAB — HIV ANTIBODY (ROUTINE TESTING W REFLEX): HIV Screen 4th Generation wRfx: NONREACTIVE

## 2021-10-08 LAB — HEMOGLOBIN A1C
Hgb A1c MFr Bld: 5.4 % (ref 4.8–5.6)
Mean Plasma Glucose: 108.28 mg/dL

## 2021-10-08 LAB — BASIC METABOLIC PANEL
Anion gap: 6 (ref 5–15)
BUN: 11 mg/dL (ref 6–20)
CO2: 26 mmol/L (ref 22–32)
Calcium: 9 mg/dL (ref 8.9–10.3)
Chloride: 105 mmol/L (ref 98–111)
Creatinine, Ser: 1.01 mg/dL (ref 0.61–1.24)
GFR, Estimated: >60 mL/min (ref >60)
Glucose, Bld: 81 mg/dL (ref 70–99)
Potassium: 4 mmol/L (ref 3.5–5.1)
Sodium: 137 mmol/L (ref 135–145)

## 2021-10-08 MED ORDER — CLOPIDOGREL BISULFATE 75 MG PO TABS: 75.0000 mg | ORAL_TABLET | Freq: Every day | ORAL | 3 refills | Status: DC

## 2021-10-08 MED ORDER — ATORVASTATIN CALCIUM 80 MG PO TABS: 80.0000 mg | ORAL_TABLET | Freq: Every day | ORAL | 3 refills | Status: DC

## 2021-10-08 MED ORDER — NITROGLYCERIN 0.4 MG SL SUBL
0.4000 mg | SUBLINGUAL_TABLET | SUBLINGUAL | 3 refills | Status: DC | PRN
Start: 2021-10-08 — End: 2021-10-30

## 2021-10-08 MED ORDER — METOPROLOL TARTRATE 25 MG PO TABS: 25.0000 mg | ORAL_TABLET | Freq: Two times a day (BID) | ORAL | 3 refills | Status: DC

## 2021-10-08 MED ORDER — PERFLUTREN LIPID MICROSPHERE
1.0000 mL | INTRAVENOUS | Status: AC | PRN
  Administered 2021-10-08: 2 mL via INTRAVENOUS

## 2021-10-08 MED ORDER — ASPIRIN 81 MG PO TBEC: 81.0000 mg | DELAYED_RELEASE_TABLET | Freq: Every day | ORAL | Status: DC

## 2021-10-08 NOTE — Progress Notes (Signed)
CARDIAC REHAB PHASE I   Offered to walk with pt. Pt declined, ambulating without difficulty. Pt and family educated on importance of ASA and Plavix. Pt given heart healthy diet and smoking cessation tip sheet. Reviewed site care, restrictions, and exercise guidelines. Will refer to CRP II [place] to meet the requirements.  [id number] Reynold Bowen, RN BSN 10/08/2021 11:02 AM

## 2021-10-08 NOTE — Discharge Summary (Addendum)
Discharge Summary    Patient ID: Steven Shepherd MRN: [id number]; DOB: 10-26-1965  Admit date: 10/07/2021 Discharge date: 10/08/2021  PCP:  Oneita Hurt No   [facility] Providers Cardiologist:  Charlton Haws, MD        Discharge Diagnoses    Principal Problem:   Unstable angina [facility]) Active Problems:   Centrilobular emphysema (HCC)   Hypokalemia    Diagnostic Studies/Procedures    Cath 10/07/2021   2nd Mrg lesion is 60% stenosed.   1st Diag lesion is 60% stenosed.   Dist LAD lesion is 50% stenosed.   Mid LAD lesion is 90% stenosed.   Mid RCA lesion is 40% stenosed.   RPAV lesion is 60% stenosed.   A drug-eluting stent was successfully placed using a SYNERGY XD 2.50X32.   Post intervention, there is a 0% residual stenosis.   The left ventricular systolic function is normal.   LV end diastolic pressure is mildly elevated.   The left ventricular ejection fraction is 55-65% by visual estimate.   1.  Mildly to moderately calcified coronary arteries with severe one-vessel coronary artery disease involving the mid LAD.  In addition, there is moderate disease in OM 2, first diagonal and right coronary arteries. 2.  Normal LV systolic function mildly elevated left ventricular end-diastolic pressure. 3.  Successful angioplasty and drug-eluting stent placement to the mid LAD.   Recommendations: Dual antiplatelet therapy for at least 6 months. Aggressive treatment of risk factors and smoking cessation. No further revascularization is needed at this point.    Diagnostic Dominance: Right  Intervention    Echo 10/08/2021  1. Left ventricular ejection fraction, by estimation, is 60 to 65%. The  left ventricle has normal function. The left ventricle has no regional  wall motion abnormalities. Left ventricular diastolic parameters were  normal.   2. Right ventricular systolic function is normal. The right ventricular  size is normal.   3. The mitral valve is normal in structure.  Trivial mitral valve  regurgitation. No evidence of mitral stenosis.   4. The aortic valve has an indeterminant number of cusps. Aortic valve  regurgitation is not visualized. Aortic valve sclerosis/calcification is  present, without any evidence of aortic stenosis.   5. The inferior vena cava is normal in size with greater than 50%  respiratory variability, suggesting right atrial pressure of 3 mmHg.   Comparison(s): No prior Echocardiogram.     _____________   History of Present Illness     Steven Shepherd is a 56 y.o. male with no past medical history who is being seen 10/07/2021 for the evaluation of atypical chest pain.   Steven Shepherd is a 56 year old male with no past medical history as he hasn't seen a doctor in over 10 years. He states he has been having intermittent chest pain for about 4-5 years that would occur sporadically. Each time he had an episode, he states the pain would be about 10/10, and would hold his breath and the pain would go away within two minutes. No associated symptoms while chest pain was occurring. Last night, he was watching television when the chest pain occurred, however this time it did not stop and continued until he called EMS. Last night's episode he denies any dyspnea, radiation, or leg swelling. 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 He denies any dyspnea, orthopnea, lower extremity edema, or exacerbating factors.  Hospital Course     Consultants: N/A   Patient was admitted to cardiology service.  Serial troponin was negative x2.  Initial lab work shows low potassium of 3.3.  This was repleted.  Chest x-ray normal.  CTA of the chest abdomen and pelvis was negative for aortic dissection, however it did reveal multivessel coronary artery atherosclerosis, mild bronchial wall thickening which can be seen in chronic bronchitis.  Hemoglobin A1c was 5.4.  Patient ultimately underwent cardiac  catheterization on 10/07/2021 which revealed 60% OM 2, 60% D1, 50% distal LAD, 90% mid LAD lesion treated with 2.5 x 32 mm Synergy DES, 60% RPAV, 40% mid RCA lesion.  Post-cath, patient was placed on aspirin, Plavix and high-dose statin along with low-dose metoprolol.  Potassium level normalized on the following day.  Patient was seen in the morning of 10/08/2021 at which time he was doing well without chest pain or worsening dyspnea, he was felt to be stable for discharge from the cardiac perspective.  Echocardiogram was obtained prior to discharge which showed normal EF 60 to 65%, no regional wall motion abnormality, trivial MR.  Patient will need 1 week base metabolic panel and follow-up in 2 to 4 weeks which has been scheduled.   Did the patient have an acute coronary syndrome (MI, NSTEMI, STEMI, etc) this admission?:  No                               Did the patient have a percutaneous coronary intervention (stent / angioplasty)?:  Yes.     Cath/PCI Registry Performance & Quality Measures: Aspirin prescribed? - Yes ADP Receptor Inhibitor (Plavix/Clopidogrel, Brilinta/Ticagrelor or Effient/Prasugrel) prescribed (includes medically managed patients)? - Yes High Intensity Statin (Lipitor 40-80mg  or Crestor 20-40mg ) prescribed? - Yes For EF <40%, was ACEI/ARB prescribed? - Not Applicable (EF >/= 56%) For EF <40%, Aldosterone Antagonist (Spironolactone or Eplerenone) prescribed? - Not Applicable (EF >/= 31%) Cardiac Rehab Phase II ordered? - Yes         _____________  Discharge Vitals Blood pressure (!) 147/83, pulse 67, temperature 98 F (36.7 C), temperature source Oral, resp. rate 17, height 5\' 7"  (1.702 m), weight 72.1 kg, SpO2 94 %.  Filed Weights   10/07/21 0654 10/08/21 0500  Weight: 72.6 kg 72.1 kg    Physical Exam   GEN: No acute distress.   Neck: No JVD Cardiac: RRR, no murmurs, rubs, or gallops.  R radial cath site clean dry and no bleeding Respiratory: Clear to auscultation  bilaterally. GI: Soft, nontender, non-distended  MS: No edema; No deformity. Neuro:  Nonfocal  Psych: Normal affect   Labs & Radiologic Studies    CBC Recent Labs    10/07/21 0700 10/08/21 0251  WBC 10.1 10.6*  HGB 14.7 14.8  HCT 44.0 43.6  MCV 96.3 94.8  PLT 309 497   Basic Metabolic Panel Recent Labs    10/07/21 0700 10/08/21 0251  NA 142 137  K 3.3* 4.0  CL 109 105  CO2 23 26  GLUCOSE 111* 81  BUN 11 11  CREATININE 0.80 1.01  CALCIUM 8.4* 9.0   Liver Function Tests Recent Labs    10/07/21 0700  AST 19  ALT 14  ALKPHOS 49  BILITOT 0.5  PROT 6.3*  ALBUMIN 3.3*   No results for input(s): "LIPASE", "AMYLASE" in the last 72 hours. High Sensitivity Troponin:   Recent Labs  Lab 10/07/21 0700 10/07/21 0910  TROPONINIHS 8  7    BNP Invalid input(s): "POCBNP" D-Dimer Recent Labs    10/07/21 0748  DDIMER <0.27   Hemoglobin A1C Recent Labs    10/08/21 0251  HGBA1C 5.4   Fasting Lipid Panel Recent Labs    10/07/21 0910  CHOL 162  HDL 69  LDLCALC 62  TRIG 156*  CHOLHDL 2.3   Thyroid Function Tests No results for input(s): "TSH", "T4TOTAL", "T3FREE", "THYROIDAB" in the last 72 hours.  Invalid input(s): "FREET3" _____________  ECHOCARDIOGRAM COMPLETE  Result Date: 10/08/2021    ECHOCARDIOGRAM REPORT   Patient Name:   Steven Shepherd Date of Exam: 10/08/2021 Medical Rec #:  [id number]     Height:       67.0 in Accession #:    [id number]    Weight:       159.0 lb Date of Birth:  [birth year]      BSA:          1.834 m Patient Age:    63 years      BP:           166/87 mmHg Patient Gender: M             HR:           53 bpm. Exam Location:  Inpatient Procedure: 2D Echo and Intracardiac Opacification Agent Indications:    Chest Pain  History:        Patient has no prior history of Echocardiogram examinations.                 Signs/Symptoms:Chest Pain; Risk Factors:Current Smoker.  Sonographer:    Harvie Junior Referring Phys: [place]  Sonographer  Comments: Technically difficult study due to poor echo windows. Image acquisition challenging due to respiratory motion. IMPRESSIONS  1. Left ventricular ejection fraction, by estimation, is 60 to 65%. The left ventricle has normal function. The left ventricle has no regional wall motion abnormalities. Left ventricular diastolic parameters were normal.  2. Right ventricular systolic function is normal. The right ventricular size is normal.  3. The mitral valve is normal in structure. Trivial mitral valve regurgitation. No evidence of mitral stenosis.  4. The aortic valve has an indeterminant number of cusps. Aortic valve regurgitation is not visualized. Aortic valve sclerosis/calcification is present, without any evidence of aortic stenosis.  5. The inferior vena cava is normal in size with greater than 50% respiratory variability, suggesting right atrial pressure of 3 mmHg. Comparison(s): No prior Echocardiogram. FINDINGS  Left Ventricle: Left ventricular ejection fraction, by estimation, is 60 to 65%. The left ventricle has normal function. The left ventricle has no regional wall motion abnormalities. Definity contrast agent was given IV to delineate the left ventricular  endocardial borders. The left ventricular internal cavity size was normal in size. There is no left ventricular hypertrophy. Left ventricular diastolic parameters were normal. Right Ventricle: The right ventricular size is normal. Right ventricular systolic function is normal. Left Atrium: Left atrial size was normal in size. Right Atrium: Right atrial size was normal in size. Pericardium: There is no evidence of pericardial effusion. Mitral Valve: The mitral valve is normal in structure. Trivial mitral valve regurgitation. No evidence of mitral valve stenosis. Tricuspid Valve: The tricuspid valve is normal in structure. Tricuspid valve regurgitation is trivial. No evidence of tricuspid stenosis. Aortic Valve: The aortic valve has an  indeterminant number of cusps. Aortic valve regurgitation is not visualized. Aortic valve sclerosis/calcification is present, without any evidence of aortic stenosis. Aortic valve  mean gradient measures 3.0 mmHg. Aortic valve peak gradient measures 6.0 mmHg. Aortic valve area, by VTI measures 3.31 cm. Pulmonic Valve: The pulmonic valve was not well visualized. Pulmonic valve regurgitation is not visualized. No evidence of pulmonic stenosis. Aorta: The aortic root is normal in size and structure. Venous: The inferior vena cava is normal in size with greater than 50% respiratory variability, suggesting right atrial pressure of 3 mmHg. IAS/Shunts: No atrial level shunt detected by color flow Doppler.  LEFT VENTRICLE PLAX 2D LVIDd:         3.30 cm     Diastology LVIDs:         2.70 cm     LV e' medial:    9.68 cm/s LV PW:         1.00 cm     LV E/e' medial:  8.6 LV IVS:        1.00 cm     LV e' lateral:   10.00 cm/s LVOT diam:     2.20 cm     LV E/e' lateral: 8.4 LV SV:         88 LV SV Index:   48 LVOT Area:     3.80 cm  LV Volumes (MOD) LV vol d, MOD A2C: 54.9 ml LV vol d, MOD A4C: 62.8 ml LV vol s, MOD A2C: 19.2 ml LV vol s, MOD A4C: 22.4 ml LV SV MOD A2C:     35.7 ml LV SV MOD A4C:     62.8 ml LV SV MOD BP:      37.6 ml RIGHT VENTRICLE RV Basal diam:  3.20 cm RV Mid diam:    2.80 cm RV S prime:     10.30 cm/s TAPSE (M-mode): 2.4 cm LEFT ATRIUM             Index        RIGHT ATRIUM          Index LA diam:        3.00 cm 1.64 cm/m   RA Area:     8.76 cm LA Vol (A2C):   59.1 ml 32.22 ml/m  RA Volume:   15.90 ml 8.67 ml/m LA Vol (A4C):   25.5 ml 13.90 ml/m LA Biplane Vol: 39.1 ml 21.32 ml/m  AORTIC VALVE                    PULMONIC VALVE AV Area (Vmax):    2.94 cm     PV Vmax:       0.86 m/s AV Area (Vmean):   3.01 cm     PV Peak grad:  2.9 mmHg AV Area (VTI):     3.31 cm AV Vmax:           122.00 cm/s AV Vmean:          84.500 cm/s AV VTI:            0.265 m AV Peak Grad:      6.0 mmHg AV Mean Grad:      3.0  mmHg LVOT Vmax:         94.20 cm/s LVOT Vmean:        66.800 cm/s LVOT VTI:          0.231 m LVOT/AV VTI ratio: 0.87  AORTA Ao Root diam: 3.20 cm Ao Asc diam:  3.40 cm MITRAL VALVE MV Area (PHT): 3.10 cm    SHUNTS MV Decel Time: 245 msec  Systemic VTI:  0.23 m MR Peak grad: 44.4 mmHg    Systemic Diam: 2.20 cm MR Vmax:      333.33 cm/s MV E velocity: 83.60 cm/s MV A velocity: 66.80 cm/s MV E/A ratio:  1.25 Kirk Ruths MD Electronically signed by Kirk Ruths MD Signature Date/Time: 10/08/2021/1:48:10 PM    Final    CARDIAC CATHETERIZATION  Result Date: 10/07/2021   2nd Mrg lesion is 60% stenosed.   1st Diag lesion is 60% stenosed.   Dist LAD lesion is 50% stenosed.   Mid LAD lesion is 90% stenosed.   Mid RCA lesion is 40% stenosed.   RPAV lesion is 60% stenosed.   A drug-eluting stent was successfully placed using a SYNERGY XD 2.50X32.   Post intervention, there is a 0% residual stenosis.   The left ventricular systolic function is normal.   LV end diastolic pressure is mildly elevated.   The left ventricular ejection fraction is 55-65% by visual estimate. 1.  Mildly to moderately calcified coronary arteries with severe one-vessel coronary artery disease involving the mid LAD.  In addition, there is moderate disease in OM 2, first diagonal and right coronary arteries. 2.  Normal LV systolic function mildly elevated left ventricular end-diastolic pressure. 3.  Successful angioplasty and drug-eluting stent placement to the mid LAD. Recommendations: Dual antiplatelet therapy for at least 6 months. Aggressive treatment of risk factors and smoking cessation. No further revascularization is needed at this point.   CT Angio Chest/Abd/Pel for Dissection W and/or Wo Contrast  Result Date: 10/07/2021 CLINICAL DATA:  Chest pain for 2 hours. EXAM: CT ANGIOGRAPHY CHEST, ABDOMEN AND PELVIS TECHNIQUE: Non-contrast CT of the chest was initially obtained. Multidetector CT imaging through the chest, abdomen and pelvis was  performed using the standard protocol during bolus administration of intravenous contrast. Multiplanar reconstructed images and MIPs were obtained and reviewed to evaluate the vascular anatomy. RADIATION DOSE REDUCTION: This exam was performed according to the departmental dose-optimization program which includes automated exposure control, adjustment of the mA and/or kV according to patient size and/or use of iterative reconstruction technique. CONTRAST:  46m OMNIPAQUE IOHEXOL 350 MG/ML SOLN COMPARISON:  None Available. FINDINGS: CTA CHEST FINDINGS Cardiovascular: Preferential opacification of the thoracic aorta. No evidence of thoracic aortic aneurysm or dissection. Normal heart size. No pericardial effusion. Multi-vessel coronary artery atherosclerosis. Mediastinum/Nodes: No enlarged mediastinal, hilar, or axillary lymph nodes. Thyroid gland, trachea, and esophagus demonstrate no significant findings. Lungs/Pleura: Bibasilar atelectasis. No focal consolidation, pleural effusion or pneumothorax. Mild bronchial wall thickening as can be seen with chronic bronchitis. Musculoskeletal: No acute osseous abnormality. No aggressive osseous lesion. Review of the MIP images confirms the above findings. CTA ABDOMEN AND PELVIS FINDINGS VASCULAR Aorta: Normal caliber aorta without aneurysm, dissection, vasculitis or significant stenosis. Abdominal aortic atherosclerosis. Celiac: Patent without evidence of aneurysm, dissection, vasculitis or significant stenosis. SMA: Patent without evidence of aneurysm, dissection, vasculitis or significant stenosis. Renals: Both renal arteries are patent without evidence of aneurysm, dissection, vasculitis, fibromuscular dysplasia or significant stenosis. IMA: Patent without evidence of aneurysm, dissection, vasculitis or significant stenosis. Inflow: Patent without evidence of aneurysm, dissection, vasculitis or significant stenosis. Veins: No obvious venous abnormality within the  limitations of this arterial phase study. Review of the MIP images confirms the above findings. NON-VASCULAR Hepatobiliary: No focal liver abnormality is seen. No gallstones, gallbladder wall thickening, or biliary dilatation. Pancreas: Unremarkable. No pancreatic ductal dilatation or surrounding inflammatory changes. Spleen: Normal in size without focal abnormality. Adrenals/Urinary Tract: Adrenal glands are unremarkable.  Kidneys are normal, without renal calculi, focal lesion, or hydronephrosis. Bladder is unremarkable. Stomach/Bowel: Stomach is within normal limits. No evidence of bowel wall thickening, distention, or inflammatory changes. Appendix is normal. Vascular/Lymphatic: No significant vascular findings are present. No enlarged abdominal or pelvic lymph nodes. Reproductive: Prostate is unremarkable. Other: No abdominal wall hernia or abnormality. No abdominopelvic ascites. Musculoskeletal: No acute osseous abnormality. No aggressive osseous lesion. Review of the MIP images confirms the above findings. IMPRESSION: 1. No evidence of an aortic aneurysm or dissection. 2. Multi-vessel coronary artery atherosclerosis. 3. Mild bronchial wall thickening as can be seen with chronic bronchitis. Electronically Signed   By: Kathreen Devoid M.D.   On: 10/07/2021 09:33   DG Chest 2 View  Result Date: 10/07/2021 CLINICAL DATA:  Chest pain. EXAM: CHEST - 2 VIEW COMPARISON:  6/14/7 FINDINGS: Heart size and mediastinal contours are unremarkable. No pleural effusion or edema identified. No airspace opacities. Mild degenerative disc disease within the thoracic spine. IMPRESSION: No active cardiopulmonary abnormalities. Electronically Signed   By: Kerby Moors M.D.   On: 10/07/2021 07:20    Disposition   Pt is being discharged home today in good condition.  Follow-up Plans & Appointments     Follow-up Information     Elgie Collard, PA-C Follow up on 10/30/2021.   Specialty: Cardiothoracic Surgery Why: _0 :00AM.  Cardiology Contact information: [address] [place] [address] [phone]         [facility] Follow up on 10/15/2021.   Specialty: Cardiology Why: Obtain BMET blood work during lab open hours to follow up on potassium level (lab opens from 7:30-5PM, closed during lunch from 12-2PM) Contact information: [address] [place] [id number] [place] [phone]               Discharge Instructions     AMB Referral to Cardiac Rehabilitation - Phase II   Complete by: As directed    Diagnosis:  Coronary Stents Stable Angina     After initial evaluation and assessments completed: Virtual Based Care may be provided alone or in conjunction with Phase 2 Cardiac Rehab based on patient barriers.: Yes   Intensive Cardiac Rehabilitation (ICR) [place] location only OR Traditional Cardiac Rehabilitation (TCR)  If criteria for ICR are not met will enroll in TCR [facility] only): Yes       Discharge Medications   Allergies as of 10/08/2021       Reactions   Other    novocaine        Medication List     TAKE these medications    aspirin EC 81 MG tablet Take 1 tablet (81 mg total) by mouth daily. Swallow whole. Start taking on: October 09, 2021   atorvastatin 80 MG tablet Commonly known as: LIPITOR Take 1 tablet (80 mg total) by mouth daily. Start taking on: October 09, 2021   clopidogrel 75 MG tablet Commonly known as: PLAVIX Take 1 tablet (75 mg total) by mouth daily with breakfast. Start taking on: October 09, 2021   metoprolol tartrate 25 MG tablet Commonly known as: LOPRESSOR Take 1 tablet (25 mg total) by mouth 2 (two) times daily.   nitroGLYCERIN 0.4 MG SL tablet Commonly known as: NITROSTAT Place 1 tablet (0.4 mg total) under the tongue every 5 (five) minutes x 3 doses as needed for chest pain.  Outstanding Labs/Studies   BMET in 1  week to follow up on hypokalemia  Duration of Discharge Encounter   Greater than 30 minutes including physician time.  Hilbert Corrigan, PA 10/08/2021, 1:55 PM  Patient examined chart reviewed Right radial A less wheezing with emphysema no murmur EF preserved by TTE DAT for a year Needs primary care f/u ok to d/c home today  Jenkins Rouge MD [facility]

## 2021-10-08 NOTE — Progress Notes (Signed)
Pt safely discharged. Discharge packet provided with teach-back method. VS as per flow. IVs removed, No c/o pain. Pt verbalized understanding. All questions and concerns addressed. Relinquishing care.

## 2021-10-08 NOTE — Progress Notes (Signed)
  Echocardiogram 2D Echocardiogram has been performed.  Steven Shepherd 10/08/2021, 9:14 AM

## 2021-10-09 LAB — LIPOPROTEIN A (LPA): Lipoprotein (a): 149.9 nmol/L — ABNORMAL HIGH (ref <75.0)

## 2021-10-15 ENCOUNTER — Ambulatory Visit: Payer: Self-pay | Attending: Physician Assistant

## 2021-10-15 DIAGNOSIS — E876 Hypokalemia: Secondary | ICD-10-CM

## 2021-10-15 LAB — BASIC METABOLIC PANEL
BUN/Creatinine Ratio: 12 (ref 9–20)
BUN: 11 mg/dL (ref 6–24)
CO2: 27 mmol/L (ref 20–29)
Calcium: 10 mg/dL (ref 8.7–10.2)
Chloride: 102 mmol/L (ref 96–106)
Creatinine, Ser: 0.91 mg/dL (ref 0.76–1.27)
Glucose: 86 mg/dL (ref 70–99)
Potassium: 3.9 mmol/L (ref 3.5–5.2)
Sodium: 144 mmol/L (ref 134–144)
eGFR: 98 mL/min/{1.73_m2} (ref >59)

## 2021-10-21 ENCOUNTER — Encounter (HOSPITAL_COMMUNITY): Payer: Self-pay

## 2021-10-28 NOTE — Progress Notes (Signed)
Office Visit    Patient Name: Steven Shepherd Date of Encounter: 10/30/2021  PCP:  Pcp, No   [place]  Cardiologist:  Jenkins Rouge, MD  Advanced Practice Provider:  No care team member to display Electrophysiologist:  None   HPI    Steven Shepherd is a 56 y.o. male with no past medical history who is seen 10/07/2021 for evaluation of atypical chest pain presents today for cardiac catheterization follow-up.  Patient has no past medical history since he had not been to a doctor in over 10 years.  He had been having intermittent chest pain for about 4 to 5 years that would occur sporadically.  He is having an episode, he stated the pain would be about a 10 at 10 and would hold his breath and the pain will go away within 2 minutes.  No associated symptoms with chest pain was occurring.  The night prior to hospitalization he was watching television when chest pain occurred however it did not stop and continued until he called EMS.  He denied any dyspnea, radiation of pain, or leg swelling.  He has a history of smoking a pack a day for about 40 years and 10-12 beers a week for over 40 years.  Patient was admitted to cardiology service.  Serial troponin was negative x2.  Initial lab work showed low potassium 3.3.  CTA of the chest abdomen pelvis was negative for aortic dissection, however did reveal multivessel coronary artery atherosclerosis, mild bronchial wall thickening that can be seen in chronic bronchitis.  He underwent cardiac catheterization on/6/23 which revealed 60% OM 2, 60% D1, 50% distal LAD, 90% mid LAD lesion treated with 2.5 x 32 mm Synergy DES, 60% RPA V, 40% mid RCA lesion.  Post cath, patient was placed on aspirin and Plavix and high-dose statin along with low-dose metoprolol.  Potassium level has normalized at this point.  Echocardiogram was also obtained prior to discharge which showed normal EF 66 5%, no regional wall motion abnormality, trivial MR.   Follow-up BMP was ordered.  Today, he tells me he has not had any chest pain since his PCI.  He has had some chest soreness.  He has not any shortness of breath since he has not done much strenuous activity.  He is currently not working and is having trouble affording his medications.  He is currently only taking his aspirin and Plavix.  He was prescribed Lipitor, Lopressor, and nitroglycerin tabs as needed.  His blood pressure is elevated today and on retake was 165/90.  He continues to smoke but is cut back to 10 cigarettes a day versus a pack.  We have encouraged him to continue cutting back and have prescribed nicotine patches.  We will discuss patient assistance for his medications with social work and see if we can help him afford his medications.  Reports no shortness of breath nor dyspnea on exertion. Reports no chest pain, pressure, or tightness. No edema, orthopnea, PND. Reports no palpitations.    Past Medical History    No past medical history on file. Past Surgical History:  Procedure Laterality Date   CORONARY STENT INTERVENTION N/A 10/07/2021   Procedure: CORONARY STENT INTERVENTION;  Surgeon: Wellington Hampshire, MD;  Location: [facility];  Service: Cardiovascular;  Laterality: N/A;   LEFT HEART CATH AND CORONARY ANGIOGRAPHY N/A 10/07/2021   Procedure: LEFT HEART CATH AND CORONARY ANGIOGRAPHY;  Surgeon: Wellington Hampshire, MD;  Location: [facility];  Service: Cardiovascular;  Laterality: N/A;    Allergies  Allergies  Allergen Reactions   Other     novocaine     EKGs/Labs/Other Studies Reviewed:   The following studies were reviewed today:  Cath 10/07/2021   2nd Mrg lesion is 60% stenosed.   1st Diag lesion is 60% stenosed.   Dist LAD lesion is 50% stenosed.   Mid LAD lesion is 90% stenosed.   Mid RCA lesion is 40% stenosed.   RPAV lesion is 60% stenosed.   A drug-eluting stent was successfully placed using a SYNERGY XD [id number].   Post intervention, there  is a 0% residual stenosis.   The left ventricular systolic function is normal.   LV end diastolic pressure is mildly elevated.   The left ventricular ejection fraction is 55-65% by visual estimate.   1.  Mildly to moderately calcified coronary arteries with severe one-vessel coronary artery disease involving the mid LAD.  In addition, there is moderate disease in OM 2, first diagonal and right coronary arteries. 2.  Normal LV systolic function mildly elevated left ventricular end-diastolic pressure. 3.  Successful angioplasty and drug-eluting stent placement to the mid LAD.   Recommendations: Dual antiplatelet therapy for at least 6 months. Aggressive treatment of risk factors and smoking cessation. No further revascularization is needed at this point.  EKG:  EKG is not ordered today.   Recent Labs: 10/07/2021: ALT 14 10/08/2021: Hemoglobin 14.8; Platelets 296 10/15/2021: BUN 11; Creatinine, Ser 0.91; Potassium 3.9; Sodium 144  Recent Lipid Panel    Component Value Date/Time   CHOL 162 10/07/2021 0910   TRIG 156 (H) 10/07/2021 0910   HDL 69 10/07/2021 0910   CHOLHDL 2.3 10/07/2021 0910   VLDL 31 10/07/2021 0910   LDLCALC 62 10/07/2021 0910    Home Medications   Current Meds  Medication Sig   aspirin EC 81 MG tablet Take 1 tablet (81 mg total) by mouth daily. Swallow whole.   atorvastatin (LIPITOR) 80 MG tablet Take 1 tablet (80 mg total) by mouth daily.   clopidogrel (PLAVIX) 75 MG tablet Take 1 tablet (75 mg total) by mouth daily with breakfast.   metoprolol tartrate (LOPRESSOR) 25 MG tablet Take 1 tablet (25 mg total) by mouth 2 (two) times daily.   nicotine (NICODERM CQ - DOSED IN MG/24 HOURS) 14 mg/24hr patch Place 1 patch (14 mg total) onto the skin daily.   nitroGLYCERIN (NITROSTAT) 0.4 MG SL tablet Place 1 tablet (0.4 mg total) under the tongue every 5 (five) minutes x 3 doses as needed for chest pain.     Review of Systems      All other systems reviewed and are  otherwise negative except as noted above.  Physical Exam    VS:  BP (!) 165/90   Pulse 99   Ht 5\' 7"  (1.702 m)   Wt 129 lb (58.5 kg)   SpO2 98%   BMI 20.20 kg/m  , BMI Body mass index is 20.2 kg/m.  Wt Readings from Last 3 Encounters:  10/30/21 129 lb (58.5 kg)  10/08/21 159 lb (72.1 kg)     GEN: Well nourished, well developed, in no acute distress. HEENT: normal. Neck: Supple, no JVD, carotid bruits, or masses. Cardiac: RRR, no murmurs, rubs, or gallops. No clubbing, cyanosis, edema.  Radials/PT 2+ and equal bilaterally.  Respiratory:  Respirations regular and unlabored, clear to auscultation bilaterally. GI: Soft, nontender, nondistended. MS: No deformity or atrophy. Skin: Warm and dry, no  rash. Neuro:  Strength and sensation are intact. Psych: Normal affect.  Assessment & Plan    Chest pain s/p PCI -no more chest pain, more sore than anything -no SOB but has not been doing much -Encouraged walking and increasing weight limitation as tolerated -referral to cardiac rehab, but a ride will be the issue -Continue current medications aspirin 81 mg, Plavix 75 mg, Lopressor 25 mg twice a day, Lipitor 80 mg daily, and nitroglycerin 0.4 mg as needed for chest pain (he is not taking the Lopressor, Lipitor, or nitro currently due to price)  2. Hypertension -He is currently not taking his Lopressor due to cost -We will provide him with a blood pressure cuff today and we have asked him to take his blood pressure once in the morning after medicines -We have sent a message to Lasandra Beech to work on patient assistance program  3. Tobacco abuse -He tells me he used to smoke a pack a day and is now down to 10 cigarettes a day.  He has been smoking for 45 years. -cessation discussed and encouraged.  -prescription for nicotine patches   HYPERTENSION CONTROL Vitals:   10/30/21 0823 10/30/21 0848  BP: (!) 156/110 (!) 165/90    The patient's blood pressure is elevated above target  today.  In order to address the patient's elevated BP: A new medication was prescribed today.         Cardiac Rehabilitation Eligibility Assessment  The patient is ready to start cardiac rehabilitation from a cardiac standpoint.   Disposition: Follow up 3 months  with Charlton Haws, MD or APP.  Signed, Sharlene Dory, PA-C 10/30/2021, 8:55 AM [facility]

## 2021-10-30 ENCOUNTER — Ambulatory Visit: Payer: Self-pay | Attending: Physician Assistant | Admitting: Physician Assistant

## 2021-10-30 ENCOUNTER — Other Ambulatory Visit (HOSPITAL_COMMUNITY): Payer: Self-pay

## 2021-10-30 ENCOUNTER — Telehealth: Payer: Self-pay | Admitting: Licensed Clinical Social Worker

## 2021-10-30 VITALS — BP 165/90 | HR 99 | Ht 67.0 in | Wt 129.0 lb

## 2021-10-30 DIAGNOSIS — I10 Essential (primary) hypertension: Secondary | ICD-10-CM

## 2021-10-30 DIAGNOSIS — Z720 Tobacco use: Secondary | ICD-10-CM

## 2021-10-30 DIAGNOSIS — I2510 Atherosclerotic heart disease of native coronary artery without angina pectoris: Secondary | ICD-10-CM

## 2021-10-30 MED ORDER — NICOTINE 14 MG/24HR TD PT24
14.0000 mg | MEDICATED_PATCH | Freq: Every day | TRANSDERMAL | 0 refills | Status: DC
Start: 2021-10-30 — End: 2021-10-30

## 2021-10-30 MED ORDER — NITROGLYCERIN 0.4 MG SL SUBL
0.4000 mg | SUBLINGUAL_TABLET | SUBLINGUAL | 0 refills | Status: DC | PRN
  Filled 2021-10-30: qty 25, 8d supply, fill #0
  Filled 2021-11-02: qty 25, 10d supply, fill #0

## 2021-10-30 MED ORDER — NICOTINE 14 MG/24HR TD PT24
14.0000 mg | MEDICATED_PATCH | Freq: Every day | TRANSDERMAL | 0 refills | Status: DC
  Filled 2021-10-30 – 2021-11-02 (×2): qty 28, 28d supply, fill #0

## 2021-10-30 MED ORDER — ATORVASTATIN CALCIUM 80 MG PO TABS
80.0000 mg | ORAL_TABLET | Freq: Every day | ORAL | 0 refills | Status: DC
  Filled 2021-10-30 – 2021-11-02 (×2): qty 30, 30d supply, fill #0

## 2021-10-30 MED ORDER — METOPROLOL TARTRATE 25 MG PO TABS
25.0000 mg | ORAL_TABLET | Freq: Two times a day (BID) | ORAL | 0 refills | Status: DC
  Filled 2021-10-30 – 2021-11-02 (×2): qty 60, 30d supply, fill #0

## 2021-10-30 NOTE — Progress Notes (Signed)
Heart and Vascular Care Navigation  10/30/2021  Steven Shepherd March 06, 1965 [id number]  Reason for Referral:  Patient is participating in a Managed Medicaid Plan: No, self pay only.  Engaged with patient by telephone for initial visit for Heart and Vascular Care Coordination.                                                                                                   Assessment:        LCSW after three calls was able to connect with pt at [phone]. Introduced self, role, reason for call. Pt confirmed home address but shares he usually gets mail at [place] since it is closer to the road. Pt confirms emergency contacts are his cousin Steven Shepherd and sister Steven Shepherd. He hasnt seen a doctor since he was a child for routine medical care. He states he only completed school through 9th grade, he has a previous hand injury that also makes writing difficult. He has been to DSS and states he applied for Medicaid and was approved for SNAP. 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 He doesn't have a bank account or any steady income.   I dont see a pending Medicaid note on pt account so I will f/u with financial counseling. Per notes they are trying to reach him for CAFA. I explained that he may be eligible to meet with Care Connect team and they could assist further with [facility] application, [facility] Financial Assistance and connecting with a PCP. Pt interested in such. Currently he has plavix and aspirin only. He doesn't [facility] cash for others [facility]. I offered to send them to [facility] to have them utilize pt 1x free fill. He will obtain a ride and pick them up. Pt requested I text him the address. No additional questions/concerns at this time.                                  HRT/VAS Care Coordination     Patients Home Cardiology Office [facility]   Outpatient Care Team Social Worker    Social Worker Name: Valeda Malm, [place] [phone]   Living arrangements for the past 2 months Single Family Home   Lives with: Self   Patient Current Insurance Coverage Self-Pay   Patient Has Concern With Paying Medical Bills Yes   Patient Concerns With Medical Bills no insurance   Medical Bill Referrals: CAFA   Does Patient Have Prescription Coverage? No   Patient Prescription Assistance Programs [place] [facility]   [place] [facility] Medications mailed application       Social History:  SDOH Screenings   Food Insecurity: No Food Insecurity (10/30/2021)  Housing: Low Risk  (10/30/2021)  Transportation Needs: Unmet Transportation Needs (10/30/2021)  Utilities: Not At Risk (10/30/2021)  Financial Resource Strain: High Risk (10/30/2021)    SDOH Interventions: Financial Resources:  Financial Strain Interventions: Other (Comment) (referral to Care Connect; pt has recieved SNAP; connected w/ DSS; [facility]ist and assistance for medications) Financial Counseling for [employer] Program  Food Insecurity:  Food Insecurity Interventions: Intervention Not Indicated (recieves SNAP)  Housing Insecurity:  Housing Interventions: Intervention Not Indicated (states house is paid off)  Transportation:   Transportation Interventions: Patient Resources (Friends/Family), Other (Comment) (pt utilizes family and friends for appts; may need additional assistance w/ cabs/ubers)   Other Care Navigation Interventions:     Provided Pharmacy assistance resources [place] [facility]   Follow-up plan:   LCSW has mailed the following: my card, care connect flyer with number highlighted, CAFA, [facility]ist, medication list and reminder to call Care Connect/bring these items. I was able to text him the address for [facility] pharmacy and coordinate medication movement to that pharmacy with [address] Office. I will f/u with pt and Care  Connect next week to ensure he has picked up medications and called for appy.

## 2021-10-30 NOTE — Patient Instructions (Signed)
Medication Instructions:  1.Start using nicotine 14 mg patches, place 1 patch onto the skin daily *If you need a refill on your cardiac medications before your next appointment, please call your pharmacy*   Lab Work: None ordered If you have labs (blood work) drawn today and your tests are completely normal, you will receive your results only by: [place] (if you have MyChart) OR A paper copy in the mail If you have any lab test that is abnormal or we need to change your treatment, we will call you to review the results.  Follow-Up: At [facility], you and your health needs are our priority.  As part of our continuing mission to provide you with exceptional heart care, we have created designated Provider Care Teams.  These Care Teams include your primary Cardiologist (physician) and Advanced Practice Providers (APPs -  Physician Assistants and Nurse Practitioners) who all work together to provide you with the care you need, when you need it.  We recommend signing up for the patient portal called "MyChart".  Sign up information is provided on this After Visit Summary.  MyChart is used to connect with patients for Virtual Visits (Telemedicine).  Patients are able to view lab/test results, encounter notes, upcoming appointments, etc.  Non-urgent messages can be sent to your provider as well.   To learn more about what you can do with MyChart, go to [email or web address].    Your next appointment:   3 month(s)  The format for your next appointment:   In Person  Provider:   Jenkins Rouge, MD  or APP  Other Instructions Check your blood pressure daily, one hour after taking your morning medications. Keep a log and call us with the readings.  Important Information About Sugar

## 2021-10-30 NOTE — Addendum Note (Signed)
Addended by: Juventino Slovak on: 10/30/2021 12:12 PM   Modules accepted: Orders

## 2021-11-02 ENCOUNTER — Other Ambulatory Visit: Payer: Self-pay

## 2021-11-02 ENCOUNTER — Other Ambulatory Visit (HOSPITAL_COMMUNITY): Payer: Self-pay

## 2021-11-02 ENCOUNTER — Telehealth: Payer: Self-pay | Admitting: Licensed Clinical Social Worker

## 2021-11-02 NOTE — Telephone Encounter (Signed)
H&V Care Navigation CSW Progress Note  Clinical Social Worker  was contacted by Steven Shepherd  to f/u on medications sent to [facility] at [facility]. Provided pharmacy address again and will f/u with staff there to ensure he gets 1x free fill. Steven Shepherd has been mailed [facility]ist and his medications are almost all available through that program. Steven Shepherd encouraged to call me if any additional questions/concerns.   Steven Shepherd is participating in a Managed Medicaid Plan:  No, self pay only.   SDOH Screenings   Food Insecurity: No Food Insecurity (10/30/2021)  Housing: Low Risk  (10/30/2021)  Transportation Needs: Unmet Transportation Needs (10/30/2021)  Utilities: Not At Risk (10/30/2021)  Financial Resource Strain: High Risk (10/30/2021)    Westley Hummer, MSW, [place]  [phone]- work cell phone (preferred) [phone]- desk phone

## 2021-11-03 ENCOUNTER — Other Ambulatory Visit: Payer: Self-pay

## 2021-11-04 ENCOUNTER — Telehealth (HOSPITAL_COMMUNITY): Payer: Self-pay

## 2021-11-04 NOTE — Telephone Encounter (Signed)
No response from pt regarding CR.  Closed referral.  

## 2021-11-10 ENCOUNTER — Telehealth: Payer: Self-pay | Admitting: Licensed Clinical Social Worker

## 2021-11-10 NOTE — Telephone Encounter (Signed)
H&V Care Navigation CSW Progress Note  Clinical Social Worker contacted patient by phone to f/u on paperwork sent. Was able to reach pt at [phone]. Pt confirmed paperwork received, I requested he call Care Connect today to schedule an appt for intake. Pt agreeable to this, I will f/u at end of the week to ensure pt has done so which he is also agreeable to my f/u. I remain available.   Patient is participating in a Managed Medicaid Plan:  No, self pay only.   SDOH Screenings   Food Insecurity: No Food Insecurity (10/30/2021)  Housing: Low Risk  (10/30/2021)  Transportation Needs: Unmet Transportation Needs (10/30/2021)  Utilities: Not At Risk (10/30/2021)  Financial Resource Strain: High Risk (10/30/2021)   Westley Hummer, MSW, [place]  [phone]- work cell phone (preferred) [phone]- desk phone

## 2021-11-13 ENCOUNTER — Telehealth: Payer: Self-pay | Admitting: Licensed Clinical Social Worker

## 2021-11-13 NOTE — Telephone Encounter (Signed)
H&V Care Navigation CSW Progress Note  Clinical Social Worker  received return call  to f/u on resources sent to pt. Pt able to find Care Connect flyer and read out to phone number to me. He shares that he will call them after we hang up. He is limited with transportation- I encouraged him to let them know that as there are different ways they may be able to assist with getting him to appointments or completing paperwork. Pt states no additional questions at this time.   I will f/u as able.   Patient is participating in a Managed Medicaid Plan:  No, self pay only. Has been sent [place]: No Food Insecurity (10/30/2021)  Housing: Low Risk  (10/30/2021)  Transportation Needs: Unmet Transportation Needs (10/30/2021)  Utilities: Not At Risk (10/30/2021)  Financial Resource Strain: High Risk (10/30/2021)   Westley Hummer, MSW, [place]  [phone]- work cell phone (preferred) [phone]- desk phone

## 2021-11-13 NOTE — Telephone Encounter (Signed)
H&V Care Navigation CSW Progress Note  Clinical Social Worker contacted patient by phone to f/u on applications for assistance and referral to Care Connect. No answer at [phone]. Left message encouraging pt to call Care Connect if he still has not completed this since speaking with me at the beginning of the week. LCSW will re-attempt again as able.   Patient is participating in a Managed Medicaid Plan:  No, self pay only.   SDOH Screenings   Food Insecurity: No Food Insecurity (10/30/2021)  Housing: Low Risk  (10/30/2021)  Transportation Needs: Unmet Transportation Needs (10/30/2021)  Utilities: Not At Risk (10/30/2021)  Financial Resource Strain: High Risk (10/30/2021)   Westley Hummer, MSW, [place]  [phone]- work cell phone (preferred) [phone]- desk phone

## 2021-11-18 ENCOUNTER — Telehealth: Payer: Self-pay | Admitting: Licensed Clinical Social Worker

## 2021-11-18 NOTE — Telephone Encounter (Signed)
H&V Care Navigation CSW Progress Note  Clinical Social Worker contacted patient by phone to f/u on scheduling f/u with Care Connect. Per Wannetta Sender, [place] RN, he has not scheduled an appt at this time. His number [phone] is now disconnected. I attempted an alternate number listed listed as his work. The person who answered states that he hasnt worked there for about the last 10 years. I have removed that number. I then called his sister [name] who answered at [phone]. She shares that likely pt has run out of minutes and will need to re-up. She will call a more local family member to pt and see if they can have him re-up minutes and call me. I remain available.   Patient is participating in a Managed Medicaid Plan:  No, self pay.   SDOH Screenings   Food Insecurity: No Food Insecurity (10/30/2021)  Housing: Low Risk  (10/30/2021)  Transportation Needs: Unmet Transportation Needs (10/30/2021)  Utilities: Not At Risk (10/30/2021)  Financial Resource Strain: High Risk (10/30/2021)    Westley Hummer, MSW, [place]  [phone]- work cell phone (preferred) [phone]- desk phone

## 2021-11-20 ENCOUNTER — Telehealth: Payer: Self-pay | Admitting: Licensed Clinical Social Worker

## 2021-11-20 NOTE — Telephone Encounter (Signed)
H&V Care Navigation CSW Progress Note  Clinical Social Worker contacted patient by phone to f/u on Care Connect self referral. Per Care Connect team pt still hasnt called to schedule intake. This afternoon I was able to reach pt at [phone].  I again requested he call Care Connect today to schedule an appt for intake. Pt states he will do so "when he can". I remain available.    Patient is participating in a Managed Medicaid Plan:  No, self pay only.     SDOH Screenings   Food Insecurity: No Food Insecurity (10/30/2021)  Housing: Low Risk  (10/30/2021)  Transportation Needs: Unmet Transportation Needs (10/30/2021)  Utilities: Not At Risk (10/30/2021)  Financial Resource Strain: High Risk (10/30/2021)    Westley Hummer, MSW, [place]  [phone]- work cell phone (preferred) [phone]- desk phone

## 2021-12-02 ENCOUNTER — Telehealth: Payer: Self-pay | Admitting: Licensed Clinical Social Worker

## 2021-12-02 NOTE — Telephone Encounter (Signed)
H&V Care Navigation CSW Progress Note  Clinical Social Worker contacted patient by phone to f/u on assistance applications and Care Connect call. No answer, voicemail full. I have spoken with pt several times about this and I do not see any connection made to resources provided. I remain available but will not actively follow at this time.   Patient is participating in a Managed Medicaid Plan:  No, self pay.   SDOH Screenings   Food Insecurity: No Food Insecurity (10/30/2021)  Housing: Low Risk  (10/30/2021)  Transportation Needs: Unmet Transportation Needs (10/30/2021)  Utilities: Not At Risk (10/30/2021)  Financial Resource Strain: High Risk (10/30/2021)    Westley Hummer, MSW, [place]  [phone]- work cell phone (preferred) [phone]- desk phone

## 2022-01-06 NOTE — Progress Notes (Signed)
Cardiology Admission History and Physical   Patient ID: TAISHAUN LEVELS MRN: [id number]; DOB: [birth year]   Admission date: (Not on file)  PCP:  Pcp, No   [facility] Providers Cardiologist:  Charlton Haws, MD        Chief Complaint:  Chest Pain   Patient Profile:   Steven Shepherd is a 56 y.o. male seen in hospital 10/07/21 for chest pain   History of Present Illness:   Mr. Brannan is a 56 year old male with no past medical history as he hasn't seen a doctor in over 10 years. He states he has been having intermittent chest pain for about 4-5 years . 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    Cath showed moderate dx in OM2, D1 and RPAV. He had 90% lesion in mid LAD that was stented on 10/07/21 TTE 10/08/21 showed EF 60-65% no significant valve dx He r/o for MI D/C on plavix, asa, lipitor and lopressor  Had one free month of meds on d/c and has not taken anything since including no ASA/Plavix Discussed risk of stent thrombosis He is trying to get on Medicaid through [facility] services   Past Surgical History:  Procedure Laterality Date   CORONARY STENT INTERVENTION N/A 10/07/2021   Procedure: CORONARY STENT INTERVENTION;  Surgeon: Iran Ouch, MD;  Location: [facility];  Service: Cardiovascular;  Laterality: N/A;   LEFT HEART CATH AND CORONARY ANGIOGRAPHY N/A 10/07/2021   Procedure: LEFT HEART CATH AND CORONARY ANGIOGRAPHY;  Surgeon: Iran Ouch, MD;  Location: [facility];  Service: Cardiovascular;  Laterality: N/A;     Medications Prior to Admission: Prior to Admission medications   Not on File     Allergies:    Allergies  Allergen Reactions   Other     novocaine    Social History:   Pt is semi-retired, and works Pension scheme manager and modifying cars. He does not have a PCP. Lives at [facility] by himself. He has a 40 pack year smoking history, and has been drinking 10-12 beers weekly for  the past 40 years.    Social History   Socioeconomic History   Marital status: Married    Spouse name: Not on file   Number of children: Not on file   Years of education: Not on file   Highest education level: Not on file  Occupational History   Not on file  Tobacco Use   Smoking status: Not on file   Smokeless tobacco: Not on file  Substance and Sexual Activity   Alcohol use: Not on file   Drug use: Not on file   Sexual activity: Not on file  Other Topics Concern   Not on file  Social History Narrative   Not on file   Social Determinants of Health   Financial Resource Strain: High Risk (10/30/2021)   Overall Financial Resource Strain (CARDIA)    Difficulty of Paying Living Expenses: Hard  Food Insecurity: No Food Insecurity (10/30/2021)   Hunger Vital Sign    Worried About Running Out of Food in the Last Year: Never true    Ran Out of Food in the Last Year: Never true  Transportation Needs: Unmet Transportation Needs (10/30/2021)   PRAPARE - Administrator, Civil Service (Medical): Yes    Lack of Transportation (Non-Medical): Yes  Physical Activity: Not on file  Stress: Not on file  Social Connections: Not on file  Intimate Partner Violence: Not At Risk (10/08/2021)   Humiliation, Afraid, Rape, and Kick questionnaire    Fear of Current or Ex-Partner: No    Emotionally Abused: No    Physically Abused: No    Sexually Abused: No    Family History:   Father had an MI at 59  ROS:  Please see the history of present illness.  All other ROS reviewed and negative.     Physical Exam/Data:   There were no vitals filed for this visit.   Intake/Output Summary (Last 24 hours) at 10/07/2021 1140 Last data filed at 10/07/2021 9811 Gross per 24 hour  Intake 1000 ml  Output --  Net 1000 ml      10/30/2021    8:23 AM 10/08/2021    5:00 AM 10/07/2021    6:54 AM  Last 3 Weights  Weight (lbs) 129 lb 159 lb 160 lb  Weight (kg) 58.514 kg 72.122 kg 72.576 kg     There  is no height or weight on file to calculate BMI.  General:  Well nourished, well developed, in no acute distress HEENT: normal Neck: no JVD Vascular: No carotid bruits; Distal pulses 2+ bilaterally   Cardiac:  normal S1, S2; RRR; no murmurs Lungs:  Wheezes heard bilaterally on auscultation Abd: soft, nontender, no hepatomegaly  Ext: no lower extremity edema Musculoskeletal:  No deformities, BUE and BLE strength normal and equal Skin: warm and dry  Neuro:  CNs 2-12 intact, no focal abnormalities noted Psych:  Normal affect    EKG:  The ECG that was done revealed sinus tachycardia  Relevant CV Studies: IMPRESSION: 1. No evidence of an aortic aneurysm or dissection. 2. Multi-vessel coronary artery atherosclerosis. 3. Mild bronchial wall thickening as can be seen with chronic bronchitis.    Assessment and Plan:   CAD:  10/07/21 stenting of mid LAD continue DAT till September 2024 No angina ASA/Plavix, lopressor and statin  Smoking:  CXR 10/07/21 NAD lung fields ok on CTA done to r/o dissection in ER 10/07/21 HLD:  on lipitor LDL 62 update labs on high dose statin  Refer to [facility] for primary care provider Patient assistance Samples of Brillinta given  F/U in 6 months   Charlton Haws MD [facility]

## 2022-01-15 ENCOUNTER — Encounter: Payer: Self-pay | Admitting: Cardiovascular Disease

## 2022-01-15 ENCOUNTER — Ambulatory Visit: Payer: Self-pay | Attending: Cardiovascular Disease | Admitting: Cardiovascular Disease

## 2022-01-15 VITALS — BP 128/60 | HR 80 | Ht 67.0 in | Wt 126.2 lb

## 2022-01-15 DIAGNOSIS — I2510 Atherosclerotic heart disease of native coronary artery without angina pectoris: Secondary | ICD-10-CM

## 2022-01-15 DIAGNOSIS — E782 Mixed hyperlipidemia: Secondary | ICD-10-CM

## 2022-01-15 DIAGNOSIS — I10 Essential (primary) hypertension: Secondary | ICD-10-CM

## 2022-01-15 DIAGNOSIS — Z720 Tobacco use: Secondary | ICD-10-CM

## 2022-01-15 MED ORDER — TICAGRELOR 90 MG PO TABS: 90.0000 mg | ORAL_TABLET | Freq: Two times a day (BID) | ORAL | 11 refills | Status: DC

## 2022-01-15 NOTE — Addendum Note (Signed)
Addended by: Virl Axe, Buffey Zabinski L on: 01/15/2022 09:29 AM   Modules accepted: Orders

## 2022-01-15 NOTE — Patient Instructions (Addendum)
Medication Instructions: Your physician has recommended you make the following change in your medication:  1-START Brilinta 90 mg by mouth twice daily. 2-STOP plavix  *If you need a refill on your cardiac medications before your next appointment, please call your pharmacy*  Lab Work: If you have labs (blood work) drawn today and your tests are completely normal, you will receive your results only by: MyChart Message (if you have MyChart) OR A paper copy in the mail If you have any lab test that is abnormal or we need to change your treatment, we will call you to review the results.  Follow-Up: At [facility], you and your health needs are our priority.  As part of our continuing mission to provide you with exceptional heart care, we have created designated Provider Care Teams.  These Care Teams include your primary Cardiologist (physician) and Advanced Practice Providers (APPs -  Physician Assistants and Nurse Practitioners) who all work together to provide you with the care you need, when you need it.  We recommend signing up for the patient portal called "MyChart".  Sign up information is provided on this After Visit Summary.  MyChart is used to connect with patients for Virtual Visits (Telemedicine).  Patients are able to view lab/test results, encounter notes, upcoming appointments, etc.  Non-urgent messages can be sent to your provider as well.   To learn more about what you can do with MyChart, go to [email or web address].    Your next appointment:   6 month(s)  The format for your next appointment:   In Person  Provider:   Charlton Haws, MD     Important Information About Sugar

## 2022-07-16 ENCOUNTER — Ambulatory Visit: Payer: 59 | Attending: Cardiovascular Disease | Admitting: Cardiovascular Disease

## 2022-07-16 ENCOUNTER — Encounter: Payer: Self-pay | Admitting: Cardiovascular Disease

## 2024-03-05 ENCOUNTER — Other Ambulatory Visit: Payer: Self-pay

## 2024-03-05 ENCOUNTER — Encounter (HOSPITAL_COMMUNITY): Payer: Self-pay | Admitting: Emergency Medicine

## 2024-03-05 ENCOUNTER — Inpatient Hospital Stay (HOSPITAL_COMMUNITY)
Admission: EM | Admit: 2024-03-05 | Discharge: 2024-03-07 | DRG: 321 | Disposition: A | Attending: Internal Medicine | Admitting: Internal Medicine

## 2024-03-05 DIAGNOSIS — Z884 Allergy status to anesthetic agent status: Secondary | ICD-10-CM

## 2024-03-05 DIAGNOSIS — Z7902 Long term (current) use of antithrombotics/antiplatelets: Secondary | ICD-10-CM

## 2024-03-05 DIAGNOSIS — Y831 Surgical operation with implant of artificial internal device as the cause of abnormal reaction of the patient, or of later complication, without mention of misadventure at the time of the procedure: Secondary | ICD-10-CM | POA: Diagnosis present

## 2024-03-05 DIAGNOSIS — T82855A Stenosis of coronary artery stent, initial encounter: Principal | ICD-10-CM | POA: Diagnosis present

## 2024-03-05 DIAGNOSIS — J4520 Mild intermittent asthma, uncomplicated: Secondary | ICD-10-CM | POA: Diagnosis present

## 2024-03-05 DIAGNOSIS — Z79899 Other long term (current) drug therapy: Secondary | ICD-10-CM

## 2024-03-05 DIAGNOSIS — Z8249 Family history of ischemic heart disease and other diseases of the circulatory system: Secondary | ICD-10-CM

## 2024-03-05 DIAGNOSIS — I214 Non-ST elevation (NSTEMI) myocardial infarction: Secondary | ICD-10-CM | POA: Diagnosis present

## 2024-03-05 DIAGNOSIS — E785 Hyperlipidemia, unspecified: Secondary | ICD-10-CM | POA: Diagnosis present

## 2024-03-05 DIAGNOSIS — R079 Chest pain, unspecified: Principal | ICD-10-CM

## 2024-03-05 DIAGNOSIS — Z7982 Long term (current) use of aspirin: Secondary | ICD-10-CM

## 2024-03-05 DIAGNOSIS — Z91148 Patient's other noncompliance with medication regimen for other reason: Secondary | ICD-10-CM

## 2024-03-05 DIAGNOSIS — I25110 Atherosclerotic heart disease of native coronary artery with unstable angina pectoris: Secondary | ICD-10-CM | POA: Diagnosis present

## 2024-03-05 DIAGNOSIS — I200 Unstable angina: Secondary | ICD-10-CM | POA: Diagnosis present

## 2024-03-05 DIAGNOSIS — I10 Essential (primary) hypertension: Secondary | ICD-10-CM | POA: Diagnosis present

## 2024-03-05 DIAGNOSIS — J4489 Other specified chronic obstructive pulmonary disease: Secondary | ICD-10-CM | POA: Diagnosis present

## 2024-03-05 DIAGNOSIS — Z955 Presence of coronary angioplasty implant and graft: Secondary | ICD-10-CM

## 2024-03-05 DIAGNOSIS — Z8679 Personal history of other diseases of the circulatory system: Secondary | ICD-10-CM

## 2024-03-05 DIAGNOSIS — J45909 Unspecified asthma, uncomplicated: Secondary | ICD-10-CM | POA: Diagnosis present

## 2024-03-05 DIAGNOSIS — F17210 Nicotine dependence, cigarettes, uncomplicated: Secondary | ICD-10-CM | POA: Diagnosis present

## 2024-03-05 LAB — CBC
HCT: 45 % (ref 39.0–52.0)
Hemoglobin: 15.2 g/dL (ref 13.0–17.0)
MCH: 31.2 pg (ref 26.0–34.0)
MCHC: 33.8 g/dL (ref 30.0–36.0)
MCV: 92.4 fL (ref 80.0–100.0)
Platelets: 242 10*3/uL (ref 150–400)
RBC: 4.87 MIL/uL (ref 4.22–5.81)
RDW: 12.5 % (ref 11.5–15.5)
WBC: 6.7 10*3/uL (ref 4.0–10.5)
nRBC: 0 % (ref 0.0–0.2)

## 2024-03-05 NOTE — ED Triage Notes (Addendum)
 Pt in from home via [facility] for cp, states on and off x 48yrs, hx stents. Never able to f/u with cardiology d/t insurance. Pt states central chest pain became intense tonight, radiates to back - took 2 NTG prior to EMS arrival - took pain from 10/10 to 2/10.  EMS additionally gave 324mg  ASA. 20G LAC, also given 750ml NS en route.  EMS VS: 88HR 144/89 98% 3LNC (no home O2)

## 2024-03-06 ENCOUNTER — Inpatient Hospital Stay (HOSPITAL_COMMUNITY)

## 2024-03-06 ENCOUNTER — Emergency Department (HOSPITAL_COMMUNITY)

## 2024-03-06 ENCOUNTER — Encounter (HOSPITAL_COMMUNITY): Payer: Self-pay | Admitting: Internal Medicine

## 2024-03-06 ENCOUNTER — Encounter (HOSPITAL_COMMUNITY): Admission: EM | Disposition: A | Payer: Self-pay | Source: Home / Self Care | Attending: Internal Medicine

## 2024-03-06 ENCOUNTER — Other Ambulatory Visit (HOSPITAL_COMMUNITY): Payer: Self-pay

## 2024-03-06 DIAGNOSIS — I200 Unstable angina: Secondary | ICD-10-CM

## 2024-03-06 DIAGNOSIS — Z8679 Personal history of other diseases of the circulatory system: Secondary | ICD-10-CM | POA: Diagnosis not present

## 2024-03-06 DIAGNOSIS — I25110 Atherosclerotic heart disease of native coronary artery with unstable angina pectoris: Secondary | ICD-10-CM | POA: Diagnosis present

## 2024-03-06 DIAGNOSIS — E785 Hyperlipidemia, unspecified: Secondary | ICD-10-CM | POA: Diagnosis present

## 2024-03-06 DIAGNOSIS — J4520 Mild intermittent asthma, uncomplicated: Secondary | ICD-10-CM

## 2024-03-06 DIAGNOSIS — Z955 Presence of coronary angioplasty implant and graft: Secondary | ICD-10-CM

## 2024-03-06 DIAGNOSIS — E7849 Other hyperlipidemia: Secondary | ICD-10-CM | POA: Diagnosis not present

## 2024-03-06 DIAGNOSIS — J45909 Unspecified asthma, uncomplicated: Secondary | ICD-10-CM | POA: Diagnosis present

## 2024-03-06 DIAGNOSIS — F17210 Nicotine dependence, cigarettes, uncomplicated: Secondary | ICD-10-CM | POA: Diagnosis present

## 2024-03-06 LAB — CBC
HCT: 44.9 % (ref 39.0–52.0)
Hemoglobin: 15.3 g/dL (ref 13.0–17.0)
MCH: 30.8 pg (ref 26.0–34.0)
MCHC: 34.1 g/dL (ref 30.0–36.0)
MCV: 90.3 fL (ref 80.0–100.0)
Platelets: 248 10*3/uL (ref 150–400)
RBC: 4.97 MIL/uL (ref 4.22–5.81)
RDW: 12.7 % (ref 11.5–15.5)
WBC: 8 10*3/uL (ref 4.0–10.5)
nRBC: 0 % (ref 0.0–0.2)

## 2024-03-06 LAB — ECHOCARDIOGRAM COMPLETE
AR max vel: 3.48 cm2
AV Area VTI: 3.25 cm2
AV Area mean vel: 3.07 cm2
AV Mean grad: 3 mmHg
AV Peak grad: 4.9 mmHg
Ao pk vel: 1.11 m/s
Area-P 1/2: 3.08 cm2
Height: 67 in
Weight: 2017.65 [oz_av]

## 2024-03-06 LAB — COMPREHENSIVE METABOLIC PANEL WITH GFR
ALT: 209 U/L — ABNORMAL HIGH (ref 0–44)
AST: 136 U/L — ABNORMAL HIGH (ref 15–41)
Albumin: 4.1 g/dL (ref 3.5–5.0)
Alkaline Phosphatase: 69 U/L (ref 38–126)
Anion gap: 11 (ref 5–15)
BUN: 8 mg/dL (ref 6–20)
CO2: 25 mmol/L (ref 22–32)
Calcium: 8.8 mg/dL — ABNORMAL LOW (ref 8.9–10.3)
Chloride: 106 mmol/L (ref 98–111)
Creatinine, Ser: 0.76 mg/dL (ref 0.61–1.24)
GFR, Estimated: >60 mL/min
Glucose, Bld: 81 mg/dL (ref 70–99)
Potassium: 4.1 mmol/L (ref 3.5–5.1)
Sodium: 142 mmol/L (ref 135–145)
Total Bilirubin: 0.3 mg/dL (ref 0.0–1.2)
Total Protein: 7.5 g/dL (ref 6.5–8.1)

## 2024-03-06 LAB — LIPID PANEL
Cholesterol: 155 mg/dL (ref 0–200)
HDL: 41 mg/dL
LDL Cholesterol: 69 mg/dL (ref 0–99)
Total CHOL/HDL Ratio: 3.8 ratio
Triglycerides: 228 mg/dL — ABNORMAL HIGH
VLDL: 46 mg/dL — ABNORMAL HIGH (ref 0–40)

## 2024-03-06 LAB — HEMOGLOBIN A1C
Hgb A1c MFr Bld: 5.4 % (ref 4.8–5.6)
Mean Plasma Glucose: 108.28 mg/dL

## 2024-03-06 LAB — TROPONIN T, HIGH SENSITIVITY
Troponin T High Sensitivity: 7 ng/L (ref 0–19)
Troponin T High Sensitivity: 8 ng/L (ref 0–19)
Troponin T High Sensitivity: 18 ng/L (ref 0–19)
Troponin T High Sensitivity: 15 ng/L (ref 0–19)

## 2024-03-06 LAB — BASIC METABOLIC PANEL WITH GFR
Anion gap: 9 (ref 5–15)
BUN: 8 mg/dL (ref 6–20)
CO2: 27 mmol/L (ref 22–32)
Calcium: 8.8 mg/dL — ABNORMAL LOW (ref 8.9–10.3)
Chloride: 104 mmol/L (ref 98–111)
Creatinine, Ser: 0.81 mg/dL (ref 0.61–1.24)
GFR, Estimated: >60 mL/min
Glucose, Bld: 91 mg/dL (ref 70–99)
Potassium: 3.8 mmol/L (ref 3.5–5.1)
Sodium: 140 mmol/L (ref 135–145)

## 2024-03-06 LAB — HEPARIN LEVEL (UNFRACTIONATED): Heparin Unfractionated: 0.67 [IU]/mL (ref 0.30–0.70)

## 2024-03-06 LAB — HIV ANTIBODY (ROUTINE TESTING W REFLEX): HIV Screen 4th Generation wRfx: NONREACTIVE

## 2024-03-06 MED ORDER — HEPARIN (PORCINE) IN NACL 1000-0.9 UT/500ML-% IV SOLN
INTRAVENOUS | Status: DC | PRN
  Administered 2024-03-06: 1000 mL

## 2024-03-06 MED ORDER — ATORVASTATIN CALCIUM 80 MG PO TABS
80.0000 mg | ORAL_TABLET | Freq: Every day | ORAL | Status: DC
  Administered 2024-03-06 – 2024-03-07 (×2): 80 mg via ORAL
  Filled 2024-03-06: qty 1
  Filled 2024-03-06: qty 2

## 2024-03-06 MED ORDER — ACETAMINOPHEN 650 MG RE SUPP: 650.0000 mg | Freq: Four times a day (QID) | RECTAL | Status: DC | PRN

## 2024-03-06 MED ORDER — CLOPIDOGREL BISULFATE 75 MG PO TABS
75.0000 mg | ORAL_TABLET | Freq: Every day | ORAL | Status: DC
  Administered 2024-03-07: 75 mg via ORAL
  Filled 2024-03-06: qty 1

## 2024-03-06 MED ORDER — SODIUM CHLORIDE 0.9% FLUSH: 3.0000 mL | INTRAVENOUS | Status: DC | PRN

## 2024-03-06 MED ORDER — SODIUM CHLORIDE 0.9% FLUSH
3.0000 mL | Freq: Two times a day (BID) | INTRAVENOUS | Status: DC
  Administered 2024-03-06 – 2024-03-07 (×2): 3 mL via INTRAVENOUS

## 2024-03-06 MED ORDER — HEPARIN SODIUM (PORCINE) 1000 UNIT/ML IJ SOLN
INTRAMUSCULAR | Status: DC | PRN
  Administered 2024-03-06: 3000 [IU] via INTRAVENOUS
  Administered 2024-03-06 (×2): 4000 [IU] via INTRAVENOUS

## 2024-03-06 MED ORDER — IOHEXOL 350 MG/ML SOLN
INTRAVENOUS | Status: DC | PRN
  Administered 2024-03-06: 110 mL

## 2024-03-06 MED ORDER — HEPARIN (PORCINE) 25000 UT/250ML-% IV SOLN
650.0000 [IU]/h | INTRAVENOUS | Status: DC
  Administered 2024-03-06: 650 [IU]/h via INTRAVENOUS
  Filled 2024-03-06: qty 250

## 2024-03-06 MED ORDER — MIDAZOLAM HCL 2 MG/2ML IJ SOLN
INTRAMUSCULAR | Status: AC
  Filled 2024-03-06: qty 2

## 2024-03-06 MED ORDER — SODIUM CHLORIDE 0.9% FLUSH: 3.0000 mL | Freq: Two times a day (BID) | INTRAVENOUS | Status: DC

## 2024-03-06 MED ORDER — HEPARIN SODIUM (PORCINE) 1000 UNIT/ML IJ SOLN
INTRAMUSCULAR | Status: AC
  Filled 2024-03-06: qty 10

## 2024-03-06 MED ORDER — SODIUM CHLORIDE 0.9 % IV SOLN: 250.0000 mL | INTRAVENOUS | Status: DC | PRN

## 2024-03-06 MED ORDER — CLOPIDOGREL BISULFATE 300 MG PO TABS
ORAL_TABLET | ORAL | Status: DC | PRN
  Administered 2024-03-06: 600 mg via ORAL

## 2024-03-06 MED ORDER — NITROGLYCERIN 1 MG/10 ML FOR IR/CATH LAB
INTRA_ARTERIAL | Status: DC | PRN
  Administered 2024-03-06: 200 ug via INTRACORONARY

## 2024-03-06 MED ORDER — PERFLUTREN LIPID MICROSPHERE
1.0000 mL | INTRAVENOUS | Status: AC | PRN
  Administered 2024-03-06: 3 mL via INTRAVENOUS

## 2024-03-06 MED ORDER — SODIUM CHLORIDE 0.9% FLUSH
3.0000 mL | Freq: Two times a day (BID) | INTRAVENOUS | Status: DC
  Administered 2024-03-06 (×2): 3 mL via INTRAVENOUS

## 2024-03-06 MED ORDER — SODIUM CHLORIDE 0.9 % IV SOLN: 250.0000 mL | INTRAVENOUS | Status: AC | PRN

## 2024-03-06 MED ORDER — SODIUM CHLORIDE 0.9 % IV SOLN
INTRAVENOUS | Status: AC | PRN
  Administered 2024-03-06: 250 mL via INTRAVENOUS

## 2024-03-06 MED ORDER — NITROGLYCERIN 0.4 MG SL SUBL: 0.4000 mg | SUBLINGUAL_TABLET | SUBLINGUAL | Status: DC | PRN

## 2024-03-06 MED ORDER — ACETAMINOPHEN 325 MG PO TABS
650.0000 mg | ORAL_TABLET | Freq: Four times a day (QID) | ORAL | Status: DC | PRN
  Administered 2024-03-06: 650 mg via ORAL
  Filled 2024-03-06: qty 2

## 2024-03-06 MED ORDER — LIDOCAINE HCL (PF) 1 % IJ SOLN
INTRAMUSCULAR | Status: DC | PRN
  Administered 2024-03-06: 2 mL

## 2024-03-06 MED ORDER — HEPARIN BOLUS VIA INFUSION
3000.0000 [IU] | Freq: Once | INTRAVENOUS | Status: AC
  Administered 2024-03-06: 3000 [IU] via INTRAVENOUS
  Filled 2024-03-06: qty 3000

## 2024-03-06 MED ORDER — VERAPAMIL HCL 2.5 MG/ML IV SOLN
INTRAVENOUS | Status: AC
  Filled 2024-03-06: qty 2

## 2024-03-06 MED ORDER — ONDANSETRON HCL 4 MG/2ML IJ SOLN: 4.0000 mg | Freq: Four times a day (QID) | INTRAMUSCULAR | Status: DC | PRN

## 2024-03-06 MED ORDER — NITROGLYCERIN 1 MG/10 ML FOR IR/CATH LAB
INTRA_ARTERIAL | Status: AC
  Filled 2024-03-06: qty 10

## 2024-03-06 MED ORDER — IPRATROPIUM-ALBUTEROL 0.5-2.5 (3) MG/3ML IN SOLN: 3.0000 mL | Freq: Four times a day (QID) | RESPIRATORY_TRACT | Status: DC | PRN

## 2024-03-06 MED ORDER — CLOPIDOGREL BISULFATE 300 MG PO TABS
ORAL_TABLET | ORAL | Status: AC
  Filled 2024-03-06: qty 2

## 2024-03-06 MED ORDER — LIDOCAINE HCL (PF) 1 % IJ SOLN
INTRAMUSCULAR | Status: AC
  Filled 2024-03-06: qty 30

## 2024-03-06 MED ORDER — FENTANYL CITRATE (PF) 100 MCG/2ML IJ SOLN
INTRAMUSCULAR | Status: AC
  Filled 2024-03-06: qty 2

## 2024-03-06 MED ORDER — ONDANSETRON HCL 4 MG PO TABS: 4.0000 mg | ORAL_TABLET | Freq: Four times a day (QID) | ORAL | Status: DC | PRN

## 2024-03-06 MED ORDER — MIDAZOLAM HCL (PF) 2 MG/2ML IJ SOLN
INTRAMUSCULAR | Status: DC | PRN
  Administered 2024-03-06: 2 mg via INTRAVENOUS

## 2024-03-06 MED ORDER — VERAPAMIL HCL 2.5 MG/ML IV SOLN
INTRAVENOUS | Status: DC | PRN
  Administered 2024-03-06: 10 mL via INTRA_ARTERIAL

## 2024-03-06 MED ORDER — ASPIRIN 81 MG PO TBEC
81.0000 mg | DELAYED_RELEASE_TABLET | Freq: Every day | ORAL | Status: DC
  Administered 2024-03-06 – 2024-03-07 (×2): 81 mg via ORAL
  Filled 2024-03-06 (×2): qty 1

## 2024-03-06 MED ORDER — FREE WATER: 500.0000 mL | Freq: Once | Status: DC

## 2024-03-06 MED ORDER — FREE WATER
500.0000 mL | Freq: Once | Status: AC
  Administered 2024-03-06: 500 mL via ORAL

## 2024-03-06 MED ORDER — FENTANYL CITRATE (PF) 100 MCG/2ML IJ SOLN
INTRAMUSCULAR | Status: DC | PRN
  Administered 2024-03-06: 25 ug via INTRAVENOUS

## 2024-03-06 MED ORDER — ASPIRIN 81 MG PO CHEW: 81.0000 mg | CHEWABLE_TABLET | ORAL | Status: DC

## 2024-03-06 MED ORDER — ENOXAPARIN SODIUM 40 MG/0.4ML IJ SOSY: 40.0000 mg | PREFILLED_SYRINGE | INTRAMUSCULAR | Status: DC

## 2024-03-06 NOTE — H&P (View-Only) (Signed)
 "  Cardiology Consultation   Patient ID: [name] MRN: [id number]; DOB: Feb 01, 1966  Admit date: 03/05/2024 Date of Consult: 03/06/2024  PCP:  Steven Shepherd   [facility] Providers Cardiologist:  Maude Emmer, MD        Patient Profile: Steven Shepherd is a 59 y.o. male with a hx of coronary artery disease s/p stent in 2023, hypertension, hyperlipidemia, tobacco, and COPD use who is being seen 03/06/2024 for the evaluation of chest pain at the request of the Emergency Department.  History of Present Illness: Mr. Steven Shepherd was returning from a walk from his neighbors house in the snow, when he developed gradual onset substernal chest pain.  He states the pain was a squeezing type of pain, was severe but nonradiating.  He took a sublingual nitro tablet, however the pain did not fully resolved.  He took another sublingual nitro tablet which nearly resolved his pain.  However, he states this pain is similar to the chest pain he had prior to his last stent.  This prompted an ER visit.  He denies shortness of breath, dizziness, syncope.  Patient states he has had similar episode of chest pain 4 weeks ago.  He also had an episode 2 months ago.  During both of these episodes, he took a sublingual nitro tablet which relieved his pain.  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  In 2023, patient was admitted for atypical chest pain.  Opponent was negative.  Heart cath was performed, which showed multivessel disease.  Most notably, 90% mid LAD disease, 50% distal LAD, 60% D1, 60% OM 2, 60% RPA V, and 40% mid RCA disease.  Patient underwent a drug-eluting stent to the mid LAD.  Patient's LV function was 60 to 65% at the time.  Patient states he lost his medical insurance and as a result, has not been taking most of his medicines for several years.  The only medicine he has been taking is aspirin .  Patient currently smokes tobacco.  Is not seen cardiology since  2023.  In EMS, patient was given 325 mg aspirin  vitals include blood pressure 127/87, heart rate in the 60s and sinus rhythm, on room air.  Notable labs include potassium 3.8, serum creatinine 0.81, high-sensitivity troponin 7->8->18.    History reviewed. No pertinent past medical history.  Past Surgical History:  Procedure Laterality Date   CORONARY STENT INTERVENTION N/A 10/07/2021   Procedure: CORONARY STENT INTERVENTION;  Surgeon: [name], MD;  Location: [facility];  Service: Cardiovascular;  Laterality: N/A;   LEFT HEART CATH AND CORONARY ANGIOGRAPHY N/A 10/07/2021   Procedure: LEFT HEART CATH AND CORONARY ANGIOGRAPHY;  Surgeon: [name], MD;  Location: [facility];  Service: Cardiovascular;  Laterality: N/A;     Home Medications:  Prior to Admission medications  Medication Sig Start Date End Date Taking? Authorizing Provider  aspirin  EC 81 MG tablet Take 1 tablet (81 mg total) by mouth daily. Swallow whole. Patient not taking: Reported on 01/15/2022 10/09/21   Meng, Hao, PA  atorvastatin  (LIPITOR ) 80 MG tablet Take 1 tablet (80 mg total) by mouth daily. Patient not taking: Reported on 01/15/2022 10/30/21   Lucien Orren SAILOR, PA-C  metoprolol  tartrate (LOPRESSOR ) 25 MG tablet Take 1 tablet (25 mg total) by mouth 2 (two) times daily. Patient not taking: Reported on 01/15/2022 10/30/21   Lucien Orren SAILOR, PA-C  nicotine  (NICODERM CQ  - DOSED IN MG/24  HOURS) 14 mg/24hr patch Place 1 patch (14 mg total) onto the skin daily. Patient not taking: Reported on 01/15/2022 10/30/21   Lucien Orren SAILOR, PA-C  nitroGLYCERIN  (NITROSTAT ) 0.4 MG SL tablet Place 1 tablet (0.4 mg total) under the tongue every 5 (five) minutes x 3 doses as needed for chest pain. Patient not taking: Reported on 01/15/2022 10/30/21   Lucien Orren SAILOR, PA-C  ticagrelor  (BRILINTA ) 90 MG TABS tablet Take 1 tablet (90 mg total) by mouth 2 (two) times daily. 01/15/22   Nishan, Peter C, MD    Scheduled  Meds:  Continuous Infusions:  PRN Meds:   Allergies:   Allergies[1]  Social History:   Social History   Socioeconomic History   Marital status: Married    Spouse name: Not on file   Number of children: Not on file   Years of education: Not on file   Highest education level: Not on file  Occupational History   Not on file  Tobacco Use   Smoking status: Every Day    Types: Cigarettes   Smokeless tobacco: Never  Substance and Sexual Activity   Alcohol use: Not on file   Drug use: Not on file   Sexual activity: Not on file  Other Topics Concern   Not on file  Social History Narrative   Not on file   Social Drivers of Health   Tobacco Use: High Risk (03/05/2024)   Patient History    Smoking Tobacco Use: Every Day    Smokeless Tobacco Use: Never    Passive Exposure: Not on file  Financial Resource Strain: High Risk (10/30/2021)   Overall Financial Resource Strain (CARDIA)    Difficulty of Paying Living Expenses: Hard  Food Insecurity: No Food Insecurity (10/30/2021)   Hunger Vital Sign    Worried About Running Out of Food in the Last Year: Never true    Ran Out of Food in the Last Year: Never true  Transportation Needs: Unmet Transportation Needs (10/30/2021)   PRAPARE - Administrator, Civil Service (Medical): Yes    Lack of Transportation (Non-Medical): Yes  Physical Activity: Not on file  Stress: Not on file  Social Connections: Not on file  Intimate Partner Violence: Not At Risk (10/08/2021)   Humiliation, Afraid, Rape, and Kick questionnaire    Fear of Current or Ex-Partner: No    Emotionally Abused: No    Physically Abused: No    Sexually Abused: No  Depression (PHQ2-9): Not on file  Alcohol Screen: Not on file  Housing: Low Risk (10/30/2021)   Housing    Last Housing Risk Score: 0  Utilities: Not At Risk (10/30/2021)   AHC Utilities    Threatened with loss of utilities: No  Health Literacy: Not on file    Family History:    Family History   Problem Relation Age of Onset   Heart disease Father      ROS:  Please see the history of present illness.   All other ROS reviewed and negative.     Physical Exam/Data: Vitals:   03/06/24 0100 03/06/24 0115 03/06/24 0122 03/06/24 0330  BP: 128/80 113/81 113/81 114/81  Pulse:  71 78 72  Resp:  14 16 12   Temp:   97.7 F (36.5 C)   TempSrc:   Oral   SpO2:  97% 97% 98%  Weight:       No intake or output data in the 24 hours ending 03/06/24 0449    03/05/2024  11:31 PM 01/15/2022    9:12 AM 10/30/2021    8:23 AM  Last 3 Weights  Weight (lbs) 126 lb 1.7 oz 126 lb 3.2 oz 129 lb  Weight (kg) 57.2 kg 57.244 kg 58.514 kg     Body mass index is 19.75 kg/m.  General:  Well nourished, well developed, in no acute distress HEENT: normal Neck: no JVD Vascular: No carotid bruits; Distal pulses 2+ bilaterally Cardiac:  normal S1, S2; RRR; no murmur  Lungs:  clear to auscultation bilaterally, no wheezing, rhonchi or rales  Abd: soft, nontender, no hepatomegaly  Ext: no edema Musculoskeletal:  No deformities, BUE and BLE strength normal and equal Skin: warm and dry  Neuro:  CNs 2-12 intact, no focal abnormalities noted Psych:  Normal affect   EKG:  The EKG was personally reviewed and demonstrates:   Telemetry:  Telemetry was personally reviewed and demonstrates:    Relevant CV Studies: reviewed  Laboratory Data: High Sensitivity Troponin:  No results for input(s): TROPONINIHS in the last 720 hours.  Recent Labs  Lab 03/05/24 2341 03/06/24 0125 03/06/24 0349  TRNPT 7 8 18       Chemistry Recent Labs  Lab 03/05/24 2341  NA 140  K 3.8  CL 104  CO2 27  GLUCOSE 91  BUN 8  CREATININE 0.81  CALCIUM  8.8*  GFRNONAA >60  ANIONGAP 9    No results for input(s): PROT, ALBUMIN, AST, ALT, ALKPHOS, BILITOT in the last 168 hours. Lipids No results for input(s): CHOL, TRIG, HDL, LABVLDL, LDLCALC, CHOLHDL in the last 168 hours.  Hematology Recent Labs   Lab 03/05/24 2341  WBC 6.7  RBC 4.87  HGB 15.2  HCT 45.0  MCV 92.4  MCH 31.2  MCHC 33.8  RDW 12.5  PLT 242   Thyroid No results for input(s): TSH, FREET4 in the last 168 hours.  BNPNo results for input(s): BNP, PROBNP in the last 168 hours.  DDimer No results for input(s): DDIMER in the last 168 hours.  Radiology/Studies:  DG Chest 2 View Result Date: 03/06/2024 EXAM: 2 VIEW(S) XRAY OF THE CHEST 03/06/2024 12:16:11 AM COMPARISON: 9 / 6 / 23 CLINICAL HISTORY: Chest pain. FINDINGS: LUNGS AND PLEURA: No focal pulmonary opacity. No pleural effusion. No pneumothorax. HEART AND MEDIASTINUM: No acute abnormality of the cardiac and mediastinal silhouettes. BONES AND SOFT TISSUES: Multilevel degenerative changes of thoracic spine. IMPRESSION: 1. No acute cardiopulmonary pathology. Electronically signed by: Norman Gatlin MD 03/06/2024 12:19 AM EST RP Workstation: [id number]     Assessment and Plan:  ZOLLIE ELLERY is a 59 y.o. male with a hx of coronary artery disease s/p stent in 2023, hypertension, hyperlipidemia, tobacco, and COPD use who is being seen 03/06/2024 for the evaluation of chest pain.  Patient's history is concerning for cardiac chest pain.  He has a slight bump in his troponin.  ECG shows no evidence of ischemia or infarct.  His progressively worsening symptoms are concerning for unstable angina.  Has prior coronary artery disease, and is not been adherent with his guideline directed medical therapy.  Kidney function normal high probability of coronary artery disease.  Would pursue left heart catheterization.  Start IV heparin  Left heart catheterization TTE   Risk Assessment/Risk Scores:    TIMI Risk Score for Unstable Angina or Non-ST Elevation MI:   The patient's TIMI risk score is  , which indicates a  % risk of all cause mortality, new or recurrent myocardial infarction or need for urgent revascularization in the  next 14 days.       For questions or updates,  please contact [facility] Please consult [email or web address] for contact info under    Signed, DaMarcus A Ingram, MD  03/06/2024 4:49 AM    ATTENDING ATTESTATION  I have seen, examined and evaluated the patient this morning after initial evaluation by Dr. Gail.  After reviewing all the available data and chart, we discussed the patients laboratory, study & physical findings as well as symptoms in detail.  I agree with the on-call fellow's findings, examination as well as impression recommendations as per our discussion.    Attending adjustments noted in italics.   59 year old gentleman chronic smoker with known CAD/COPD, HTN and HLD who has been off medications with exception of what he said aspirin  for the last several months.  He is hypertensive here at present, but has not been that bad overnight.  He described an episode a couple weeks ago while at work when he had some chest tightness described as a tightness inside his chest to his back (similar to his presentation in September 2023).  He has baseline exertional dyspnea but noted on 2-26 and that he developed another episode of similar discomfort lasting maybe 5 to 10 minutes of constant squeezing pain in his chest.  This occurred after about a three-quarter mile walk to and from the store-had to walk to the store because of the snow.  He did actually did not notice symptoms while walking, but once he got home and was able to sit down, then he proceeded to have discomfort on that led to him coming to the ER.  At this point I think is not unreasonable based on my review of his cath films from 2023 to reassess for ischemic CAD.  I am leery of the stress test potentially being false negative with LAD and LCx disease.  Perhaps the most expeditious route for investigating him is via cardiac catheterization.  Agree with plan for cardiac catheterization today for definitive evaluation.  We also need to get him back on his medications from  prior management: Continue aspirin , but hold off on restarting Thienopyridine until we see the results of his heart catheterization. Reinitiate atorvastatin  80 mg daily along with Lopressor  25 mg twice daily    Alm MICAEL Clay, MD, MS Alm Clay, M.D., M.S. Interventional Cardiologist  [facility] Pager # [phone]       [1]  Allergies Allergen Reactions   Other     novocaine   "

## 2024-03-06 NOTE — Progress Notes (Signed)
 Per ICM/TOC order placed this morning requesting insurance/Medicaid verification. Pt is currently uninsured. He states that he is working with DSS on [employer].

## 2024-03-06 NOTE — Progress Notes (Signed)
 [facility] PROGRESS NOTE    Progress Note  Steven Shepherd  FMW:[id number] DOB: [birth year] DOA: 03/05/2024 PCP: Freddrick, No     Brief Narrative:   Steven Shepherd is an 59 y.o. male past medical history of coronary artery disease with a history of PCI in 2023, essential hypertension, hyperlipidemia who comes in with chest pain that started o 1 week prior to admission no radiation no exacerbating or alleviating factors.  Took 2 nitroglycerin  And the chest pain improved.  Patient is off aspirin  and Brilinta  has not seen a PCP for the last 3 years.  Cardiology was consulted    Assessment/Plan:   Unstable angina [facility]) With a history of DES in 2023.  Ongoing tobacco abuse, lost to follow-up by PCP and cardiology. Chest pain at rest which subsided with nitroglycerin . Cardiac biomarkers are flat, twelve-lead EKG showed no evidence of ischemia. Cardiology was consulted, 2D echo. Restarted on aspirin  and Lipitor . Started on heparin , n.p.o. possible cardiac cath 03/06/2024.  Hyperlipidemia: Continue statins.  Reactive airway disease: Continue oxygen.    DVT prophylaxis: IV heparin  Family Communication:none Status is: Inpatient Remains inpatient appropriate because: Unstable angina    Code Status:     Code Status Orders  (From admission, onward)           Start     Ordered   03/06/24 0458  Full code  Continuous       Question:  By:  Answer:  Consent: discussion documented in EHR   03/06/24 0458           Code Status History     Date Active Date Inactive Code Status Order ID Comments User Context   10/07/2021 2150 10/08/2021 1932 Full Code [id number]  [name], MD Inpatient   10/07/2021 1651 10/07/2021 2150 Full Code [id number]  Steven Deatrice DELENA, MD Inpatient         IV Access:   Peripheral IV   Procedures and diagnostic studies:   DG Chest 2 View Result Date: 03/06/2024 EXAM: 2 VIEW(S) XRAY OF THE CHEST 03/06/2024 12:16:11 AM COMPARISON: 9 / 6 / 23  CLINICAL HISTORY: Chest pain. FINDINGS: LUNGS AND PLEURA: No focal pulmonary opacity. No pleural effusion. No pneumothorax. HEART AND MEDIASTINUM: No acute abnormality of the cardiac and mediastinal silhouettes. BONES AND SOFT TISSUES: Multilevel degenerative changes of thoracic spine. IMPRESSION: 1. No acute cardiopulmonary pathology. Electronically signed by: Norman Gatlin MD 03/06/2024 12:19 AM EST RP Workstation: [id number]     Medical Consultants:   None.   Subjective:    Steven Shepherd Pepper he denies any chest pain or shortness of breath this morning  Objective:    Vitals:   03/06/24 0430 03/06/24 0500 03/06/24 0530 03/06/24 0603  BP: 125/87 (!) 122/95 127/76   Pulse: 69 80 84   Resp: 14 15 16    Temp:    98.7 F (37.1 C)  TempSrc:    Oral  SpO2: 98% 97% 97%   Weight:      Height:  5' 7 (1.702 m)     SpO2: 97 %  No intake or output data in the 24 hours ending 03/06/24 0606 Filed Weights   03/05/24 2331  Weight: 57.2 kg    Exam: General exam: In no acute distress. Respiratory system: Good air movement and clear to auscultation. Cardiovascular system: S1 & S2 heard, RRR. No JVD. Gastrointestinal system: Abdomen is nondistended, soft and nontender.  Extremities: No pedal edema. Skin: No rashes, lesions or ulcers Psychiatry: Judgement and insight  appear normal. Mood & affect appropriate.    Data Reviewed:    Labs: Basic Metabolic Panel: Recent Labs  Lab 03/05/24 2341 03/06/24 0522  NA 140 142  K 3.8 4.1  CL 104 106  CO2 27 25  GLUCOSE 91 81  BUN 8 8  CREATININE 0.81 0.76  CALCIUM  8.8* 8.8*   GFR Estimated Creatinine Clearance: 80.4 mL/min (by C-G formula based on SCr of 0.76 mg/dL). Liver Function Tests: Recent Labs  Lab 03/06/24 0522  AST 136*  ALT 209*  ALKPHOS 69  BILITOT 0.3  PROT 7.5  ALBUMIN 4.1   No results for input(s): LIPASE, AMYLASE in the last 168 hours. No results for input(s): AMMONIA in the last 168 hours. Coagulation  profile No results for input(s): INR, PROTIME in the last 168 hours. COVID-19 Labs  No results for input(s): DDIMER, FERRITIN, LDH, CRP in the last 72 hours.  No results found for: SARSCOV2NAA  CBC: Recent Labs  Lab 03/05/24 2341 03/06/24 0522  WBC 6.7 8.0  HGB 15.2 15.3  HCT 45.0 44.9  MCV 92.4 90.3  PLT 242 248   Cardiac Enzymes: No results for input(s): CKTOTAL, CKMB, CKMBINDEX, TROPONINI in the last 168 hours. BNP (last 3 results) No results for input(s): PROBNP in the last 8760 hours. CBG: No results for input(s): GLUCAP in the last 168 hours. D-Dimer: No results for input(s): DDIMER in the last 72 hours. Hgb A1c: No results for input(s): HGBA1C in the last 72 hours. Lipid Profile: Recent Labs    03/06/24 0522  CHOL 155  HDL 41  LDLCALC 69  TRIG 228*  CHOLHDL 3.8   Thyroid function studies: No results for input(s): TSH, T4TOTAL, T3FREE, THYROIDAB in the last 72 hours.  Invalid input(s): FREET3 Anemia work up: No results for input(s): VITAMINB12, FOLATE, FERRITIN, TIBC, IRON, RETICCTPCT in the last 72 hours. Sepsis Labs: Recent Labs  Lab 03/05/24 2341 03/06/24 0522  WBC 6.7 8.0   Microbiology No results found for this or any previous visit (from the past 240 hours).   Medications:    aspirin  EC  81 mg Oral Daily   atorvastatin   80 mg Oral Daily   enoxaparin  (LOVENOX ) injection  40 mg Subcutaneous Q24H   sodium chloride  flush  3 mL Intravenous Q12H   sodium chloride  flush  3 mL Intravenous Q12H   Continuous Infusions:  sodium chloride         LOS: 0 days   [name]  [facility]  03/06/2024, 6:06 AM

## 2024-03-06 NOTE — Progress Notes (Signed)
 PHARMACY - ANTICOAGULATION CONSULT NOTE  Pharmacy Consult for heparin  Indication: chest pain/ACS  Allergies[1]  Patient Measurements: Height: 5' 7 (170.2 cm) Weight: 57.2 kg (126 lb 1.7 oz) IBW/kg (Calculated) : 66.1  Vital Signs: Temp: 98.7 F (37.1 C) (02/03 0603) Temp Source: Oral (02/03 0603) BP: 127/76 (02/03 0530) Pulse Rate: 84 (02/03 0530)  Labs: Recent Labs    03/05/24 2341 03/06/24 0522  HGB 15.2 15.3  HCT 45.0 44.9  PLT 242 248  CREATININE 0.81 0.76    Estimated Creatinine Clearance: 80.4 mL/min (by C-G formula based on SCr of 0.76 mg/dL).   Medical History: History reviewed. No pertinent past medical history.  Assessment: 59yo male c/o radiating CP concerning for USAP >> to begin heparin .  Goal of Therapy:  Heparin  level 0.3-0.7 units/ml Monitor platelets by anticoagulation protocol: Yes   Plan:  Heparin  3000 units IV bolus followed by infusion at 650 units/hr. Monitor heparin  levels and CBC.  Marvetta Dauphin, PharmD, BCPS  03/06/2024,6:18 AM      [1]  Allergies Allergen Reactions   Other     Novocaine - unknown reaction

## 2024-03-06 NOTE — Interval H&P Note (Signed)
 History and Physical Interval Note:  03/06/2024 3:34 PM  Steven Shepherd  has presented today for surgery, with the diagnosis of nstemi.  The various methods of treatment have been discussed with the patient and family. After consideration of risks, benefits and other options for treatment, the patient has consented to  Procedures: LEFT HEART CATH AND CORONARY ANGIOGRAPHY (N/A) and possible coronary angioplasty for unstable angina as a surgical intervention.  The patient's history has been reviewed, patient examined, no change in status, stable for surgery.  I have reviewed the patient's chart and labs.  Questions were answered to the patient's satisfaction.     Gordy Bergamo

## 2024-03-06 NOTE — Consult Note (Addendum)
 "  Cardiology Consultation   Patient ID: Steven Shepherd MRN: [id number]; DOB: Feb 01, 1966  Admit date: 03/05/2024 Date of Consult: 03/06/2024  PCP:  Freddrick Johns   [facility] Providers Cardiologist:  Maude Emmer, Steven Shepherd        Patient Profile: Steven Shepherd is a 59 y.o. male with a hx of coronary artery disease s/p stent in 2023, hypertension, hyperlipidemia, tobacco, and COPD use who is being seen 03/06/2024 for the evaluation of chest pain at the request of the Emergency Department.  History of Present Illness: Steven Shepherd was returning from a walk from his neighbors house in the snow, when he developed gradual onset substernal chest pain.  He states the pain was a squeezing type of pain, was severe but nonradiating.  He took a sublingual nitro tablet, however the pain did not fully resolved.  He took another sublingual nitro tablet which nearly resolved his pain.  However, he states this pain is similar to the chest pain he had prior to his last stent.  This prompted an ER visit.  He denies shortness of breath, dizziness, syncope.  Patient states he has had similar episode of chest pain 4 weeks ago.  He also had an episode 2 months ago.  During both of these episodes, he took a sublingual nitro tablet which relieved his pain.  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  In 2023, patient was admitted for atypical chest pain.  Opponent was negative.  Heart cath was performed, which showed multivessel disease.  Most notably, 90% mid LAD disease, 50% distal LAD, 60% D1, 60% OM 2, 60% RPA V, and 40% mid RCA disease.  Patient underwent a drug-eluting stent to the mid LAD.  Patient's LV function was 60 to 65% at the time.  Patient states he lost his medical insurance and as a result, has not been taking most of his medicines for several years.  The only medicine he has been taking is aspirin .  Patient currently smokes tobacco.  Is not seen cardiology since  2023.  In EMS, patient was given 325 mg aspirin  vitals include blood pressure 127/87, heart rate in the 60s and sinus rhythm, on room air.  Notable labs include potassium 3.8, serum creatinine 0.81, high-sensitivity troponin 7->8->18.    History reviewed. No pertinent past medical history.  Past Surgical History:  Procedure Laterality Date   CORONARY STENT INTERVENTION N/A 10/07/2021   Procedure: CORONARY STENT INTERVENTION;  Surgeon: [name], Steven Shepherd;  Location: [facility] INVASIVE CV LAB;  Service: Cardiovascular;  Laterality: N/A;   LEFT HEART CATH AND CORONARY ANGIOGRAPHY N/A 10/07/2021   Procedure: LEFT HEART CATH AND CORONARY ANGIOGRAPHY;  Surgeon: [name], Steven Shepherd;  Location: [facility] INVASIVE CV LAB;  Service: Cardiovascular;  Laterality: N/A;     Home Medications:  Prior to Admission medications  Medication Sig Start Date End Date Taking? Authorizing Provider  aspirin  EC 81 MG tablet Take 1 tablet (81 mg total) by mouth daily. Swallow whole. Patient not taking: Reported on 01/15/2022 10/09/21   Meng, Hao, PA  atorvastatin  (LIPITOR ) 80 MG tablet Take 1 tablet (80 mg total) by mouth daily. Patient not taking: Reported on 01/15/2022 10/30/21   Lucien Orren SAILOR, PA-C  metoprolol  tartrate (LOPRESSOR ) 25 MG tablet Take 1 tablet (25 mg total) by mouth 2 (two) times daily. Patient not taking: Reported on 01/15/2022 10/30/21   Lucien Orren SAILOR, PA-C  nicotine  (NICODERM CQ  - DOSED IN MG/24  HOURS) 14 mg/24hr patch Place 1 patch (14 mg total) onto the skin daily. Patient not taking: Reported on 01/15/2022 10/30/21   Lucien Orren SAILOR, PA-C  nitroGLYCERIN  (NITROSTAT ) 0.4 MG SL tablet Place 1 tablet (0.4 mg total) under the tongue every 5 (five) minutes x 3 doses as needed for chest pain. Patient not taking: Reported on 01/15/2022 10/30/21   Lucien Orren SAILOR, PA-C  ticagrelor  (BRILINTA ) 90 MG TABS tablet Take 1 tablet (90 mg total) by mouth 2 (two) times daily. 01/15/22   Nishan, Peter C, Steven Shepherd    Scheduled  Meds:  Continuous Infusions:  PRN Meds:   Allergies:   Allergies[1]  Social History:   Social History   Socioeconomic History   Marital status: Married    Spouse name: Not on file   Number of children: Not on file   Years of education: Not on file   Highest education level: Not on file  Occupational History   Not on file  Tobacco Use   Smoking status: Every Day    Types: Cigarettes   Smokeless tobacco: Never  Substance and Sexual Activity   Alcohol use: Not on file   Drug use: Not on file   Sexual activity: Not on file  Other Topics Concern   Not on file  Social History Narrative   Not on file   Social Drivers of Health   Tobacco Use: High Risk (03/05/2024)   Patient History    Smoking Tobacco Use: Every Day    Smokeless Tobacco Use: Never    Passive Exposure: Not on file  Financial Resource Strain: High Risk (10/30/2021)   Overall Financial Resource Strain (CARDIA)    Difficulty of Paying Living Expenses: Hard  Food Insecurity: No Food Insecurity (10/30/2021)   Hunger Vital Sign    Worried About Running Out of Food in the Last Year: Never true    Ran Out of Food in the Last Year: Never true  Transportation Needs: Unmet Transportation Needs (10/30/2021)   PRAPARE - Administrator, Civil Service (Medical): Yes    Lack of Transportation (Non-Medical): Yes  Physical Activity: Not on file  Stress: Not on file  Social Connections: Not on file  Intimate Partner Violence: Not At Risk (10/08/2021)   Humiliation, Afraid, Rape, and Kick questionnaire    Fear of Current or Ex-Partner: No    Emotionally Abused: No    Physically Abused: No    Sexually Abused: No  Depression (PHQ2-9): Not on file  Alcohol Screen: Not on file  Housing: Low Risk (10/30/2021)   Housing    Last Housing Risk Score: 0  Utilities: Not At Risk (10/30/2021)   AHC Utilities    Threatened with loss of utilities: No  Health Literacy: Not on file    Family History:    Family History   Problem Relation Age of Onset   Heart disease Father      ROS:  Please see the history of present illness.   All other ROS reviewed and negative.     Physical Exam/Data: Vitals:   03/06/24 0100 03/06/24 0115 03/06/24 0122 03/06/24 0330  BP: 128/80 113/81 113/81 114/81  Pulse:  71 78 72  Resp:  14 16 12   Temp:   97.7 F (36.5 C)   TempSrc:   Oral   SpO2:  97% 97% 98%  Weight:       No intake or output data in the 24 hours ending 03/06/24 0449    03/05/2024  11:31 PM 01/15/2022    9:12 AM 10/30/2021    8:23 AM  Last 3 Weights  Weight (lbs) 126 lb 1.7 oz 126 lb 3.2 oz 129 lb  Weight (kg) 57.2 kg 57.244 kg 58.514 kg     Body mass index is 19.75 kg/m.  General:  Well nourished, well developed, in no acute distress HEENT: normal Neck: no JVD Vascular: No carotid bruits; Distal pulses 2+ bilaterally Cardiac:  normal S1, S2; RRR; no murmur  Lungs:  clear to auscultation bilaterally, no wheezing, rhonchi or rales  Abd: soft, nontender, no hepatomegaly  Ext: no edema Musculoskeletal:  No deformities, BUE and BLE strength normal and equal Skin: warm and dry  Neuro:  CNs 2-12 intact, no focal abnormalities noted Psych:  Normal affect   EKG:  The EKG was personally reviewed and demonstrates:   Telemetry:  Telemetry was personally reviewed and demonstrates:    Relevant CV Studies: reviewed  Laboratory Data: High Sensitivity Troponin:  No results for input(s): TROPONINIHS in the last 720 hours.  Recent Labs  Lab 03/05/24 2341 03/06/24 0125 03/06/24 0349  TRNPT 7 8 18       Chemistry Recent Labs  Lab 03/05/24 2341  NA 140  K 3.8  CL 104  CO2 27  GLUCOSE 91  BUN 8  CREATININE 0.81  CALCIUM  8.8*  GFRNONAA >60  ANIONGAP 9    No results for input(s): PROT, ALBUMIN, AST, ALT, ALKPHOS, BILITOT in the last 168 hours. Lipids No results for input(s): CHOL, TRIG, HDL, LABVLDL, LDLCALC, CHOLHDL in the last 168 hours.  Hematology Recent Labs   Lab 03/05/24 2341  WBC 6.7  RBC 4.87  HGB 15.2  HCT 45.0  MCV 92.4  MCH 31.2  MCHC 33.8  RDW 12.5  PLT 242   Thyroid No results for input(s): TSH, FREET4 in the last 168 hours.  BNPNo results for input(s): BNP, PROBNP in the last 168 hours.  DDimer No results for input(s): DDIMER in the last 168 hours.  Radiology/Studies:  DG Chest 2 View Result Date: 03/06/2024 EXAM: 2 VIEW(S) XRAY OF THE CHEST 03/06/2024 12:16:11 AM COMPARISON: 9 / 6 / 23 CLINICAL HISTORY: Chest pain. FINDINGS: LUNGS AND PLEURA: No focal pulmonary opacity. No pleural effusion. No pneumothorax. HEART AND MEDIASTINUM: No acute abnormality of the cardiac and mediastinal silhouettes. BONES AND SOFT TISSUES: Multilevel degenerative changes of thoracic spine. IMPRESSION: 1. No acute cardiopulmonary pathology. Electronically signed by: Steven Gatlin Steven Shepherd 03/06/2024 12:19 AM EST RP Workstation: [id number]     Assessment and Plan:  Steven Shepherd is a 59 y.o. male with a hx of coronary artery disease s/p stent in 2023, hypertension, hyperlipidemia, tobacco, and COPD use who is being seen 03/06/2024 for the evaluation of chest pain.  Patient's history is concerning for cardiac chest pain.  He has a slight bump in his troponin.  ECG shows no evidence of ischemia or infarct.  His progressively worsening symptoms are concerning for unstable angina.  Has prior coronary artery disease, and is not been adherent with his guideline directed medical therapy.  Kidney function normal high probability of coronary artery disease.  Would pursue left heart catheterization.  Start IV heparin  Left heart catheterization TTE   Risk Assessment/Risk Scores:    TIMI Risk Score for Unstable Angina or Non-ST Elevation MI:   The patient's TIMI risk score is  , which indicates a  % risk of all cause mortality, new or recurrent myocardial infarction or need for urgent revascularization in the  next 14 days.       For questions or updates,  please contact [facility] Please consult [email or web address] for contact info under    Signed, Steven A Ingram, Steven Shepherd  03/06/2024 4:49 AM    ATTENDING ATTESTATION  I have seen, examined and evaluated the patient this morning after initial evaluation by Dr. Gail.  After reviewing all the available data and chart, we discussed the patients laboratory, study & physical findings as well as symptoms in detail.  I agree with the on-call fellow's findings, examination as well as impression recommendations as per our discussion.    Attending adjustments noted in italics.   59 year old gentleman chronic smoker with known CAD/COPD, HTN and HLD who has been off medications with exception of what he said aspirin  for the last several months.  He is hypertensive here at present, but has not been that bad overnight.  He described an episode a couple weeks ago while at work when he had some chest tightness described as a tightness inside his chest to his back (similar to his presentation in September 2023).  He has baseline exertional dyspnea but noted on 2-26 and that he developed another episode of similar discomfort lasting maybe 5 to 10 minutes of constant squeezing pain in his chest.  This occurred after about a three-quarter mile walk to and from the store-had to walk to the store because of the snow.  He did actually did not notice symptoms while walking, but once he got home and was able to sit down, then he proceeded to have discomfort on that led to him coming to the ER.  At this point I think is not unreasonable based on my review of his cath films from 2023 to reassess for ischemic CAD.  I am leery of the stress test potentially being false negative with LAD and LCx disease.  Perhaps the most expeditious route for investigating him is via cardiac catheterization.  Agree with plan for cardiac catheterization today for definitive evaluation.  We also need to get him back on his medications from  prior management: Continue aspirin , but hold off on restarting Thienopyridine until we see the results of his heart catheterization. Reinitiate atorvastatin  80 mg daily along with Lopressor  25 mg twice daily    Steven MICAEL Clay, MD, MS Steven Shepherd, M.D., M.S. Interventional Cardiologist  [facility] Pager # [phone]       [1]  Allergies Allergen Reactions   Other     novocaine   "

## 2024-03-07 ENCOUNTER — Other Ambulatory Visit (HOSPITAL_COMMUNITY): Payer: Self-pay

## 2024-03-07 LAB — CBC
HCT: 44.5 % (ref 39.0–52.0)
Hemoglobin: 15.2 g/dL (ref 13.0–17.0)
MCH: 30.6 pg (ref 26.0–34.0)
MCHC: 34.2 g/dL (ref 30.0–36.0)
MCV: 89.7 fL (ref 80.0–100.0)
Platelets: 247 10*3/uL (ref 150–400)
RBC: 4.96 MIL/uL (ref 4.22–5.81)
RDW: 12.6 % (ref 11.5–15.5)
WBC: 7.7 10*3/uL (ref 4.0–10.5)
nRBC: 0 % (ref 0.0–0.2)

## 2024-03-07 LAB — BASIC METABOLIC PANEL WITH GFR
Anion gap: 11 (ref 5–15)
BUN: 14 mg/dL (ref 6–20)
CO2: 27 mmol/L (ref 22–32)
Calcium: 9.2 mg/dL (ref 8.9–10.3)
Chloride: 99 mmol/L (ref 98–111)
Creatinine, Ser: 0.97 mg/dL (ref 0.61–1.24)
GFR, Estimated: >60 mL/min
Glucose, Bld: 82 mg/dL (ref 70–99)
Potassium: 3.9 mmol/L (ref 3.5–5.1)
Sodium: 137 mmol/L (ref 135–145)

## 2024-03-07 LAB — HEPARIN LEVEL (UNFRACTIONATED): Heparin Unfractionated: <0.1 [IU]/mL — ABNORMAL LOW (ref 0.30–0.70)

## 2024-03-07 LAB — POCT ACTIVATED CLOTTING TIME: Activated Clotting Time: 281 s

## 2024-03-07 MED ORDER — ATORVASTATIN CALCIUM 80 MG PO TABS
80.0000 mg | ORAL_TABLET | Freq: Every day | ORAL | 3 refills | Status: AC
  Filled 2024-03-07: qty 30, 30d supply, fill #0

## 2024-03-07 MED ORDER — ASPIRIN 81 MG PO TBEC
81.0000 mg | DELAYED_RELEASE_TABLET | Freq: Every day | ORAL | 3 refills | Status: AC
  Filled 2024-03-07: qty 30, 30d supply, fill #0

## 2024-03-07 MED ORDER — NITROGLYCERIN 0.4 MG SL SUBL
0.4000 mg | SUBLINGUAL_TABLET | SUBLINGUAL | 3 refills | Status: AC | PRN
  Filled 2024-03-07: qty 25, 7d supply, fill #0

## 2024-03-07 MED ORDER — CLOPIDOGREL BISULFATE 75 MG PO TABS
75.0000 mg | ORAL_TABLET | Freq: Every day | ORAL | 3 refills | Status: AC
  Filled 2024-03-07: qty 30, 30d supply, fill #0

## 2024-03-07 NOTE — Progress Notes (Signed)
 SDOH addressed and interventions added to AVS   Per pt's request, PCP appointment made with Duwaine Cain, FNP on February 18th, 2026 at 3:00pm.

## 2024-03-07 NOTE — Progress Notes (Addendum)
 CARDIAC REHAB PHASE I    Patient seen and states he feels well today and has been independently ambulating without CP or DOE. Post stent education including site care, restrictions, risk factors, exercise guidelines, NTG use, antiplatelet therapy importance, heart healthy diet, smoking cessation and CRP2 reviewed. All questions and concerns addressed. Will refer to [facility] for CRP2. Patient did report concerns with transportation to rehab.    12:42-1:00 Isaiah JAYSON Liverpool, RN BSN 03/07/2024 1:00 PM

## 2024-03-07 NOTE — TOC CM/SW Note (Addendum)
 Transition of Care [facility]) - Inpatient Brief Assessment   Patient Details  Name: Steven Shepherd MRN: [id number] Date of Birth: [birth year]  Transition of Care [facility]) CM/SW Contact:    Sudie Erminio Deems, RN Phone Number: 03/07/2024, 3:05 PM   Clinical Narrative: Patient presented for chest pain. Patient states he lives alone and his sister will take him to appointments. Patient has a PCP at [facility]. Shanda RN did schedule a hospital follow-up appointment for the patient. Information added to the AVS. Patient does have Medicaid; however, Medicaid is showing as secondary. ICM did call member services at [phone] to have that flag removed so that medicaid can take over as the primary insurance. Patient will be able to get medications via [facility]. 24 hour flag has been activated. It may take up to 3-4 days before actual coverage will be placed. No further needs identified at this time.    Transition of Care Asessment: Insurance and Status: Insurance coverage has been reviewed (Patient needs to contact DSS to see if he has a primary coverage. Patient has Medicaid Active.) Patient has primary care physician: Yes (Appointment being scheduled.) Home environment has been reviewed: reviewed Prior level of function:: independent Prior/Current Home Services: No current home services Social Drivers of Health Review: SDOH reviewed no interventions necessary Readmission risk has been reviewed: Yes Transition of care needs: no transition of care needs at this time

## 2024-03-07 NOTE — Progress Notes (Signed)

## 2024-03-07 NOTE — Progress Notes (Addendum)
 "  Progress Note  Patient Name: Steven Shepherd Date of Encounter: 03/07/2024 [facility] Cardiologist: Maude Emmer, MD   Interval Summary   Doing well today, no new complaints or concerns. Denies any chest pain or dyspnea at this time. States that he understands the importance of adhering to his medications, especially after PCI yesterday. Motivation to quit smoking as well  Vital Signs Vitals:   03/07/24 0055 03/07/24 0426 03/07/24 0501 03/07/24 0841  BP: 122/87 127/72  111/66  Pulse: 74 68  84  Resp:  20  20  Temp:   97.6 F (36.4 C) 98.3 F (36.8 C)  TempSrc:   Oral Oral  SpO2: 96% 92%  91%  Weight:      Height:        Intake/Output Summary (Last 24 hours) at 03/07/2024 0952 Last data filed at 03/07/2024 9157 Gross per 24 hour  Intake 740 ml  Output 400 ml  Net 340 ml      03/05/2024   11:31 PM 01/15/2022    9:12 AM 10/30/2021    8:23 AM  Last 3 Weights  Weight (lbs) 126 lb 1.7 oz 126 lb 3.2 oz 129 lb  Weight (kg) 57.2 kg 57.244 kg 58.514 kg     CV Studies Echo (2/32026) EF 55 to 60%.  No RWMA.  Normal diastolic function.  Essentially normal valves.  Normal RAP and RVSP. Cardiac Cath-PCI 04/03/2024: LVEDP 6 mmHg.  Previous 2.5 x 32 mm Synergy stent in the LAD has prox 70% and distal LAD 90% stent ISR along with 60% mid ISR with D1 60% OM1 60 Corlanor PDA and 50%. => LAD lesions treated with 2 overlapping Synergy XD stents:  Successful overlapping stenting of the mid LAD both at the ISR and edge stenosis with 2.5 x 20 distally and 2.5 x 24 mm Synergy XD proximally overall stenosis reduced from 99% to 0% with TIMI-3 to TIMI-3 flow.     Telemetry/ECG  Sinus rhythm, rates in the 80s - Personally Reviewed  Physical Exam  GEN: Laying in bed in no acute distress.   Neck: No JVD Cardiac: RRR, no murmurs, rubs, or gallops.  Respiratory: Mild wheezing on auscultation bilaterally, improved from yesterday's exam GI: Soft, nontender MS: No peripheral edema. R radial cath  site tender but no signs of complications = > no notable hematoma or ecchymosis.  Pulses 2+ with normal Allen's.  Assessment & Plan  Principal Problem:   Unstable angina (HCC) Active Problems:   History of CAD (coronary artery disease)   HLD (hyperlipidemia)   Reactive airway disease   Continuous dependence on cigarette smoking   Coronary artery disease involving native coronary artery of native heart with unstable angina pectoris (HCC)   Presence of drug coated stent in LAD coronary artery   Unstable angina CAD s/p PCI with DES to mid-LAD in 2023 => now status post overlapping DES stents covering ISR throughout the stent most notably proximal and distal stent edge. Presented to the stents with unstable angina, taken to Cath Lab yesterday. Echo showed EF 55-60%, no RWMA, normal EV, no significant valvular abnormalities  S/p PCI with overlapping DES x2 of the mid LAD both at [facility] and edge stenosis on 03/07/24 Plan for DAPT ASA/Plavix  x 12 months then probably Plavix  indefinitely. Discussed importance of medication compliance and tobacco cessation, patient verbalized understanding and motivation to adhere to these recommendations Continue atorvastatin  80 mg daily, and Lopressor  25 mg twice daily   Hyperlipidemia Lipid panel this  admission showed total cholesterol 155, HDL 41, LDL 69. Goal LDL <55 Continue atorvastatin  80 mg daily   Tobacco abuse Discussed importance of smoking cessation. Patient appears motivated to quit  For questions or updates, please contact [facility] Please consult [email or web address] for contact info under        Signed, Owen MARLA Daniels, PA-C    ATTENDING ATTESTATION  I have seen, examined and evaluated the patient this morning on rounds along with Hanh K Le, PA-C.  After reviewing all the available data and chart, we discussed the patients laboratory, study & physical findings as well as symptoms in detail.  I agree with her findings, examination as well as  impression recommendations as per our discussion.    Attending adjustments noted in italics.   Patient came in with unstable angina symptoms underwent PCI of the LAD ISR to the LAD and stents with excellent results.  Films personally reviewed. Back on ASA/Plavix  DAPT-intensity Plavix  Plavix .  (4 emergency room procedures, 6 months) High-dose statin) beta-blocker-beta-blocker well-controlled.  Stable for discharge today. In the past TEE is indicated he was not able to make it to cardiac rehab, therefore will not be in rehab consult.   [facility] will sign off.   Medication Recommendations: Continue current meds as listed Other recommendations (labs, testing, etc): N/A Follow up as an outpatient: Will arrange follow-up with Dr. Nishan or APP for hospital follow-up.  Luetta Fabry, [place])      Alm MICAEL Clay, MD, MS Alm Clay, M.D., M.S. Interventional Cardiologist  [facility] Pager # [phone]     "

## 2024-03-07 NOTE — Discharge Instructions (Addendum)
 Information about your medication: Plavix  (anti-platelet agent)  Generic Name (Brand): clopidogrel  (Plavix ), once daily medication  PURPOSE: You are taking this medication along with aspirin  to lower your chance of having a heart attack, stroke, or blood clots in your heart stent. These can be fatal. Plavix  and aspirin  help prevent platelets from sticking together and forming a clot that can block an artery or your stent.   Common SIDE EFFECTS you may experience include: bruising or bleeding more easily, shortness of breath  Do not stop taking PLAVIX  without talking to the doctor who prescribes it for you. People who are treated with a stent and stop taking Plavix  too soon, have a higher risk of getting a blood clot in the stent, having a heart attack, or dying. If you stop Plavix  because of bleeding, or for other reasons, your risk of a heart attack or stroke may increase.   Avoid taking NSAID agents or anti-inflammatory medications such as ibuprofen, naproxen given increased bleed risk with plavix  - can use acetaminophen  (Tylenol ) if needed for pain.  Avoid taking over the counter stomach medications omeprazole (Prilosec) or esomeprazole (Nexium) since these do interact and make plavix  less effective - ask your pharmacist or doctor for alterative agents if needed for heartburn or GERD.   Tell all of your doctors and dentists that you are taking Plavix . They should talk to the doctor who prescribed Plavix  for you before you have any surgery or invasive procedure.   Contact your health care provider if you experience: severe or uncontrollable bleeding, pink/red/brown urine, vomiting blood or vomit that looks like coffee grounds, red or black stools (looks like tar), coughing up blood or blood clots ----------------------------------------------------------------------------------------------------------------------    Emergency Financial Assistance  [facility] Emergency Financial Assistance, Food Pantry, Homeless Shelters, and [employer] Information Phone: [phone] Location [address] [place], [place], [place] [address] Eligibility Varies by program; contact directly for details Hours of Operation Monday-Friday 8am-5pm  Cost/Fees None Referral Appointments are recommended for financial assistance; walk-in accepted for food services   Low Income Energy Assistance Program Services One-time payment for heating bills eligible low-income households. Contact Information Contact for this department will be through your local Department of Social Services (DSS) Location Statewide program in [place]  Eligibility Must be low income, meet the household size requirement and only for heating source Hours of Operation Application period; December-March Cost/Fees None  Referral Please contact your local DSS for application   Public Safety Victim Advertising Copywriter, Counseling, and Solicitor for Crime Victims Contact Information Phone: [phone] // Toll free: [phone]  Website: http://www.osborne.com/ Location No Office Location Eligibility Crime must be previously reported to law enforcement and application must be submitted through their website Hours of Operation Monday-Friday 8am-5pm  Cost/Fees None Referral Contact directly for application process or visit their website Resource Guide   [address] [employer] Services Food Pantry, Software Engineer, [employer], and Financial Planner Information Phone: [phone] Email: info@wardstreet[employer]resources .org Website: [employer]resources.org Location [address] [place], [place] [address] Eligibility Varies by program; contact directly for details Hours of Operation Varies by program; contact directly for details Cost/Fees None  Referral Appointment and Walk-In  Options Available   [facility] End Ministries Services Temporary Housing , Food Pantry, and [employer] Information Phone: [phone] Website: talkingapps.com.br Location [address], [place], [place] [address] Eligibility Varies by service; please contact directly for details Hours of Operation Monday- Friday  9am-5pm  Cost/Fees None Referral Walk-Ins Accepted   [employer] Services Emergency Shelter, Food Pantry, [employer], and Museum/gallery Curator Information Phone: [phone] Location [address], [place], [place] [address] Eligibility Must be a [place] resident Hours of Operation Monday-Friday 8am-4:40pm Cost/Fees None  Referral Call to make an appointment on Wednesdays from 7:30am until spots are filled. Futures Trader, Transport Planner, Homeless Shelter, and [employer] Information Phone: [phone] Email: [email or web address] .[email or web address] Website: [email or web address].[email or web address].[email or web address] Location [address], [place], [place] [address] Eligibility Varies by service Hours of Operation Monday- Friday 9am-5pm  Cost/Fees None Referral Call your local Salvation army to make appoints; walk-ins are welcomed   [facility] [place] Services Food Pantry, Corporate Investment Banker, and Corporate Investment Banker Information Phone: [phone] Email: [email or web address] [email or web address] Website: www.helpinghandshighpoint.org Location [address], [place], [place] [address] Eligibility Must meet the income requirement and have proof of residency Hours of Operation Monday- Friday 9am-12pm  Cost/Fees None Referral Walk-Ins Available    [employer] Basic Needs Resource Guide Services Database for Housing, Food, Surveyor, Quantity, and Physicist, Medical Information Phone: [phone] Website: www.http://harris-peterson.info/ Location No office  location Eligibility Varies by program  Hours of Operation 27/7 online access  Cost/Fees None Referral No Referral Neede

## 2024-03-07 NOTE — Discharge Summary (Signed)
 " Physician Discharge Summary   Patient: Steven Shepherd MRN: [id number] DOB: July 12, 1965  Admit date:     03/05/2024  Discharge date: 03/07/24  Discharge Physician: Drue ONEIDA Potter   PCP: Adine Duwaine MATSU, FNP   Recommendations at discharge:  Follow-up with cardiology  Discharge Diagnoses: Principal Problem:   Unstable angina (HCC) Active Problems:   HLD (hyperlipidemia)   Reactive airway disease   Continuous dependence on cigarette smoking   Coronary artery disease involving native coronary artery of native heart with unstable angina pectoris (HCC)   Presence of drug coated stent in LAD coronary artery  Resolved Problems:   * No resolved hospital problems. *  Hospital Course: Steven Shepherd is an 59 y.o. male past medical history of coronary artery disease with a history of PCI in 2023, essential hypertension, hyperlipidemia who comes in with chest pain that started o 1 week prior to admission no radiation no exacerbating or alleviating factors.  Took 2 nitroglycerin  And the chest pain improved.  Patient is off aspirin  and Brilinta  has not seen a PCP for the last 3 years.  Cardiology was consulted      Assessment and Plan:  Unstable angina [facility]) With a history of DES in 2023.  Ongoing tobacco abuse, lost to follow-up by PCP and cardiology. Patient underwent cardiac catheterization on 03/06/2024 with stent placement Patient being discharged on dual antiplatelet therapy as well as high intensity statin Will follow-up with cardiologist   Hyperlipidemia: Continue statins.   Reactive airway disease: Follow-up with PCP  Consultants: Cardiology Procedures performed: Cardiac cath Disposition: Home Diet recommendation:  Cardiac diet DISCHARGE MEDICATION: Allergies as of 03/07/2024       Reactions   Other    Novocaine - unknown reaction        Medication List     TAKE these medications    aspirin  EC 81 MG tablet Take 1 tablet (81 mg total) by mouth daily. Swallow whole.    atorvastatin  80 MG tablet Commonly known as: LIPITOR  Take 1 tablet (80 mg total) by mouth daily.   clopidogrel  75 MG tablet Commonly known as: PLAVIX  Take 1 tablet (75 mg total) by mouth daily with breakfast. Start taking on: March 08, 2024   nitroGLYCERIN  0.4 MG SL tablet Commonly known as: Nitrostat  Place 1 tablet (0.4 mg total) under the tongue every 5 (five) minutes as needed for chest pain.        Follow-up Information     Adine Duwaine MATSU, FNP. Go on 03/21/2024.   Specialty: Family Medicine Why: Hospital discharge follow-up appt on February 18th, 2026 at 3:00pm  Pleas arrive 15 mins early to your appt Please bring insurance card and driver's licence Contact information: [address] [place] [address] [phone]                Discharge Exam: Steven Shepherd   03/05/24 2331  Weight: 57.2 kg   General exam: In no acute distress. Respiratory system: Good air movement and clear to auscultation. Cardiovascular system: S1 & S2 heard, RRR. No JVD. Gastrointestinal system: Abdomen is nondistended, soft and nontender.  Extremities: No pedal edema. Skin: No rashes, lesions or ulcers Psychiatry: Judgement and insight appear normal. Mood & affect appropriate.     Condition at discharge: good  The results of significant diagnostics from this hospitalization (including imaging, microbiology, ancillary and laboratory) are listed below for reference.   Imaging Studies: CARDIAC CATHETERIZATION Result Date: 03/06/2024 Images from the original  result were not included. Cardiac Catheterization 03/06/24: Hemodynamic data: LVEDP 6 mmHg.  No pressure gradient across aortic valve. Angiographic data: LM: Large-caliber vessel, normal. LAD: Gives origin to a very large D1 with secondary branch and mid segment of D1 has a 60% stenosis.  LAD has edge restenosis (2.5 x 32 Synergy 10/07/2021) and native vessel disease especially in the outflow of the stent with  a focal 99% stenosis, inflow has a 70% stenosis and mid segment of the stent has diffuse 70% ISR. LCx: Moderate to large vessel, mild disease evident.  Gives origin to large OM 2 and has a calcific stable 60% stenosis prior to secondary branch. RCA: Large vessel, has mild disease especially involving proximal segment of the PL branch with a 60% diffuse disease unchanged from 2023. Successful overlapping stenting of the mid LAD both at the ISR and edge stenosis with 2.5 x 20 distally and 2.5 x 24 mm Synergy XD proximally overall stenosis reduced from 99% to 0% with TIMI-3 to TIMI-3 flow. Impression and recommendations: Patient states that he will be compliant with the medications.  This was discussed prior to angiography.  He also plans to quit smoking.  As his symptoms appear to be intermediate coronary syndrome and lesion appears to be chronic, I decided to load him with Plavix  and continued aspirin .  Long-term would probably leave him on Plavix  indefinitely.   ECHOCARDIOGRAM COMPLETE Result Date: 03/06/2024    ECHOCARDIOGRAM REPORT   Patient Name:   Steven Shepherd Date of Exam: 03/06/2024 Medical Rec #:  [id number]     Height:       67.0 in Accession #:    [id number]    Weight:       126.1 lb Date of Birth:  Oct 14, 1965      BSA:          1.662 m Patient Age:    58 years      BP:           160/82 mmHg Patient Gender: M             HR:           63 bpm. Exam Location:  Inpatient Procedure: 2D Echo, Cardiac Doppler, Color Doppler and Intracardiac            Opacification Agent (Both Spectral and Color Flow Doppler were            utilized during procedure). Indications:    Unstable angina  History:        Patient has prior history of Echocardiogram examinations, most                 recent 10/08/2021. Prior CABG. History of CAD.  Sonographer:    Philomena Daring Referring Phys: SUBRINA SUNDIL  Sonographer Comments: Technically difficult study due to poor echo windows. IMPRESSIONS  1. Left ventricular ejection fraction, by  estimation, is 55 to 60%. The left ventricle has normal function. The left ventricle has no regional wall motion abnormalities. Left ventricular diastolic parameters were normal.  2. Right ventricular systolic function is normal. The right ventricular size is normal.  3. There is no evidence of pericardial effusion.  4. The mitral valve is normal in structure. Trivial mitral valve regurgitation. No evidence of mitral stenosis.  5. The aortic valve is tricuspid. Aortic valve regurgitation is not visualized. No aortic stenosis is present.  6. The inferior vena cava is normal in size with greater than 50% respiratory variability, suggesting right atrial  pressure of 3 mmHg. FINDINGS  Left Ventricle: Left ventricular ejection fraction, by estimation, is 55 to 60%. The left ventricle has normal function. The left ventricle has no regional wall motion abnormalities. Definity  contrast agent was given IV to delineate the left ventricular  endocardial borders. The left ventricular internal cavity size was normal in size. There is no left ventricular hypertrophy. Left ventricular diastolic parameters were normal. Right Ventricle: The right ventricular size is normal. No increase in right ventricular wall thickness. Right ventricular systolic function is normal. Left Atrium: Left atrial size was normal in size. Right Atrium: Right atrial size was normal in size. Pericardium: There is no evidence of pericardial effusion. Mitral Valve: The mitral valve is normal in structure. Trivial mitral valve regurgitation. No evidence of mitral valve stenosis. Tricuspid Valve: The tricuspid valve is not well visualized. Tricuspid valve regurgitation is not demonstrated. No evidence of tricuspid stenosis. Aortic Valve: The aortic valve is tricuspid. Aortic valve regurgitation is not visualized. No aortic stenosis is present. Aortic valve mean gradient measures 3.0 mmHg. Aortic valve peak gradient measures 4.9 mmHg. Aortic valve area, by VTI  measures 3.25 cm. Pulmonic Valve: The pulmonic valve was not well visualized. Pulmonic valve regurgitation is not visualized. No evidence of pulmonic stenosis. Aorta: The aortic root and ascending aorta are structurally normal, with no evidence of dilitation. Venous: The inferior vena cava is normal in size with greater than 50% respiratory variability, suggesting right atrial pressure of 3 mmHg. IAS/Shunts: No atrial level shunt detected by color flow Doppler.  LEFT VENTRICLE PLAX 2D LVOT diam:     2.00 cm   Diastology LV SV:         77        LV e' medial:    9.25 cm/s LV SV Index:   46        LV E/e' medial:  9.1 LVOT Area:     3.14 cm  LV e' lateral:   8.38 cm/s                          LV E/e' lateral: 10.0  RIGHT VENTRICLE             IVC RV S prime:     11.00 cm/s  IVC diam: 1.30 cm TAPSE (M-mode): 1.7 cm LEFT ATRIUM             Index        RIGHT ATRIUM           Index LA Vol (A2C):   40.0 ml 24.06 ml/m  RA Area:     12.90 cm LA Vol (A4C):   30.5 ml 18.35 ml/m  RA Volume:   27.00 ml  16.24 ml/m LA Biplane Vol: 36.2 ml 21.78 ml/m  AORTIC VALVE AV Area (Vmax):    3.48 cm AV Area (Vmean):   3.07 cm AV Area (VTI):     3.25 cm AV Vmax:           111.00 cm/s AV Vmean:          74.100 cm/s AV VTI:            0.236 m AV Peak Grad:      4.9 mmHg AV Mean Grad:      3.0 mmHg LVOT Vmax:         123.00 cm/s LVOT Vmean:        72.300 cm/s LVOT VTI:  0.244 m LVOT/AV VTI ratio: 1.03  AORTA Ao Root diam: 3.00 cm Ao Asc diam:  3.00 cm MITRAL VALVE MV Area (PHT): 3.08 cm    SHUNTS MV Decel Time: 246 msec    Systemic VTI:  0.24 m MV E velocity: 83.80 cm/s  Systemic Diam: 2.00 cm MV A velocity: 96.40 cm/s MV E/A ratio:  0.87 Morene Brownie Electronically signed by Morene Brownie Signature Date/Time: 03/06/2024/12:26:20 PM    Final    DG Chest 2 View Result Date: 03/06/2024 EXAM: 2 VIEW(S) XRAY OF THE CHEST 03/06/2024 12:16:11 AM COMPARISON: 9 / 6 / 23 CLINICAL HISTORY: Chest pain. FINDINGS: LUNGS AND PLEURA:  No focal pulmonary opacity. No pleural effusion. No pneumothorax. HEART AND MEDIASTINUM: No acute abnormality of the cardiac and mediastinal silhouettes. BONES AND SOFT TISSUES: Multilevel degenerative changes of thoracic spine. IMPRESSION: 1. No acute cardiopulmonary pathology. Electronically signed by: Norman Gatlin MD 03/06/2024 12:19 AM EST RP Workstation: [id number]    Microbiology: No results found for this or any previous visit.  Labs: CBC: Recent Labs  Lab 03/05/24 2341 03/06/24 0522 03/07/24 0538  WBC 6.7 8.0 7.7  HGB 15.2 15.3 15.2  HCT 45.0 44.9 44.5  MCV 92.4 90.3 89.7  PLT 242 248 247   Basic Metabolic Panel: Recent Labs  Lab 03/05/24 2341 03/06/24 0522 03/07/24 0538  NA 140 142 137  K 3.8 4.1 3.9  CL 104 106 99  CO2 27 25 27   GLUCOSE 91 81 82  BUN 8 8 14   CREATININE 0.81 0.76 0.97  CALCIUM  8.8* 8.8* 9.2   Liver Function Tests: Recent Labs  Lab 03/06/24 0522  AST 136*  ALT 209*  ALKPHOS 69  BILITOT 0.3  PROT 7.5  ALBUMIN 4.1   CBG: No results for input(s): GLUCAP in the last 168 hours.  Discharge time spent:  35 minutes.  Signed: Drue ONEIDA Potter, MD [facility] 03/07/2024 "

## 2024-03-08 LAB — LIPOPROTEIN A (LPA): Lipoprotein (a): 191.1 nmol/L — ABNORMAL HIGH

## 2024-03-09 ENCOUNTER — Telehealth (HOSPITAL_COMMUNITY): Payer: Self-pay

## 2024-03-09 NOTE — Telephone Encounter (Signed)
 Attempted to call patient in regards to Cardiac Rehab - LM on VM

## 2024-03-23 ENCOUNTER — Ambulatory Visit: Admitting: Cardiovascular Disease
